# Patient Record
Sex: Male | Born: 1995
Health system: Southern US, Community
[De-identification: ages and names within clinical notes are randomized; demographics above are authoritative.]

## PROBLEM LIST (undated history)

## (undated) DIAGNOSIS — L299 Pruritus, unspecified: Secondary | ICD-10-CM

## (undated) DIAGNOSIS — M79673 Pain in unspecified foot: Secondary | ICD-10-CM

## (undated) DIAGNOSIS — R059 Cough, unspecified: Secondary | ICD-10-CM

## (undated) DIAGNOSIS — J039 Acute tonsillitis, unspecified: Secondary | ICD-10-CM

## (undated) DIAGNOSIS — R9431 Abnormal electrocardiogram [ECG] [EKG]: Secondary | ICD-10-CM

## (undated) DIAGNOSIS — R300 Dysuria: Secondary | ICD-10-CM

## (undated) DIAGNOSIS — R0602 Shortness of breath: Secondary | ICD-10-CM

## (undated) DIAGNOSIS — R5383 Other fatigue: Secondary | ICD-10-CM

## (undated) DIAGNOSIS — R42 Dizziness and giddiness: Secondary | ICD-10-CM

## (undated) DIAGNOSIS — S39012A Strain of muscle, fascia and tendon of lower back, initial encounter: Secondary | ICD-10-CM

## (undated) DIAGNOSIS — J069 Acute upper respiratory infection, unspecified: Secondary | ICD-10-CM

## (undated) DIAGNOSIS — B86 Scabies: Secondary | ICD-10-CM

## (undated) DIAGNOSIS — M545 Low back pain, unspecified: Secondary | ICD-10-CM

## (undated) DIAGNOSIS — J029 Acute pharyngitis, unspecified: Secondary | ICD-10-CM

## (undated) DIAGNOSIS — R079 Chest pain, unspecified: Secondary | ICD-10-CM

## (undated) DIAGNOSIS — M25552 Pain in left hip: Secondary | ICD-10-CM

## (undated) DIAGNOSIS — R030 Elevated blood-pressure reading, without diagnosis of hypertension: Secondary | ICD-10-CM

## (undated) HISTORY — DX: Cough, unspecified: R05.9

## (undated) HISTORY — DX: Low back pain, unspecified: M54.50

## (undated) HISTORY — DX: Acute upper respiratory infection, unspecified: J06.9

## (undated) HISTORY — DX: Other fatigue: R53.83

## (undated) HISTORY — DX: Pain in left hip: M25.552

## (undated) HISTORY — DX: Dizziness and giddiness: R42

## (undated) HISTORY — DX: Scabies: B86

## (undated) HISTORY — DX: Abnormal electrocardiogram (ECG) (EKG): R94.31

## (undated) HISTORY — DX: Pain in unspecified foot: M79.673

## (undated) HISTORY — DX: Dysuria: R30.0

## (undated) HISTORY — DX: Acute pharyngitis, unspecified: J02.9

## (undated) HISTORY — DX: Elevated blood-pressure reading, without diagnosis of hypertension: R03.0

## (undated) HISTORY — DX: Acute tonsillitis, unspecified: J03.90

## (undated) HISTORY — DX: Shortness of breath: R06.02

## (undated) HISTORY — DX: Strain of muscle, fascia and tendon of lower back, initial encounter: S39.012A

## (undated) HISTORY — DX: Chest pain, unspecified: R07.9

## (undated) HISTORY — DX: Pruritus, unspecified: L29.9

---
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
## 2013-03-12 ENCOUNTER — Ambulatory Visit: Payer: Self-pay | Admitting: Physician Assistant

## 2013-03-12 VITALS — BP 116/64 | HR 106 | Temp 101.4°F | Resp 22 | Ht 73.0 in | Wt 205.0 lb

## 2013-03-12 DIAGNOSIS — J039 Acute tonsillitis, unspecified: Secondary | ICD-10-CM

## 2013-03-12 DIAGNOSIS — J029 Acute pharyngitis, unspecified: Secondary | ICD-10-CM

## 2013-03-12 LAB — POCT CBC
Granulocyte percent: 81.9 %G — AB (ref 37–80)
HCT, POC: 47.3 % (ref 43.5–53.7)
Hemoglobin: 15 g/dL (ref 14.1–18.1)
Lymph, poc: 1.4 (ref 0.6–3.4)
MCH, POC: 29.5 pg (ref 27–31.2)
MCHC: 31.7 g/dL — AB (ref 31.8–35.4)
MCV: 93.2 fL (ref 80–97)
MID (cbc): 0.8 (ref 0–0.9)
MPV: 7.9 fL (ref 0–99.8)
POC Granulocyte: 10.2 — AB (ref 2–6.9)
POC LYMPH PERCENT: 11.3 %L (ref 10–50)
POC MID %: 6.8 %M (ref 0–12)
Platelet Count, POC: 155 10*3/uL (ref 142–424)
RBC: 5.08 M/uL (ref 4.69–6.13)
RDW, POC: 13.4 %
WBC: 12.4 10*3/uL — AB (ref 4.6–10.2)

## 2013-03-12 MED ORDER — AMOXICILLIN 500 MG PO TABS: 500.0000 mg | ORAL_TABLET | Freq: Two times a day (BID) | ORAL | Status: DC

## 2013-03-12 NOTE — Patient Instructions (Addendum)
Take ibuprofen 800 mg every 8 hours as needed for fever  Increase fluids and rest Start amoxicillin tonight.  If symptoms seem to be worsening or fail to improve over the next 3-5 days, recommend recheck.

## 2013-03-12 NOTE — Progress Notes (Signed)
Subjective:    Patient ID: Randy Barnett, male    DOB: 06-Aug-1995, 17 y.o.   MRN: [id number]  Fever  Associated symptoms include diarrhea, headaches and a sore throat. Pertinent negatives include no abdominal pain, congestion, coughing or ear pain.  Headache  Associated symptoms include a fever and a sore throat. Pertinent negatives include no abdominal pain, coughing, dizziness or ear pain.  Sore Throat  Associated symptoms include diarrhea and headaches. Pertinent negatives include no abdominal pain, congestion, coughing, ear pain, shortness of breath or trouble swallowing.  Diarrhea  Associated symptoms include chills, a fever and headaches. Pertinent negatives include no abdominal pain or coughing.   17 year old male presents for evaluation of sore throat, fever, chills, slight nausea, and fatigue.  States symptoms started 3 days ago and have persisted.  Denies nasal congestion, cough, vomiting, headache, otalgia, dizziness, or abdominal pain.  Has taken ibuprofen and ASA which has helped with the fever and body aches.  No known strep or flu contacts. Does have hx of strep as a child, but has not had in "years."  Patient is otherwise healthy with no other concerns today.     Review of Systems  Constitutional: Positive for fever, chills and fatigue.  HENT: Positive for sore throat. Negative for congestion, ear pain and trouble swallowing.   Respiratory: Negative for cough and shortness of breath.   Gastrointestinal: Positive for diarrhea. Negative for abdominal pain.  Neurological: Positive for headaches. Negative for dizziness.       Objective:   Physical Exam  Constitutional: He is oriented to person, place, and time. He appears well-developed and well-nourished.  HENT:  Head: Normocephalic and atraumatic.  Right Ear: Hearing, tympanic membrane, external ear and ear canal normal.  Left Ear: Hearing, tympanic membrane, external ear and ear canal normal.  Mouth/Throat:  Uvula is midline and mucous membranes are normal. Oropharyngeal exudate and posterior oropharyngeal erythema (3+ tonsillar swelling) present.  Eyes: Conjunctivae are normal.  Neck: Normal range of motion. Neck supple.  Cardiovascular: Normal rate, regular rhythm and normal heart sounds.   Pulmonary/Chest: Effort normal and breath sounds normal.  Lymphadenopathy:    He has cervical adenopathy.  Neurological: He is alert and oriented to person, place, and time.  Psychiatric: He has a normal mood and affect. His behavior is normal. Judgment and thought content normal.      Results for orders placed in visit on 03/12/13  POCT CBC      Result Value Range   WBC 12.4 (*) 4.6 - 10.2 K/uL   Lymph, poc 1.4  0.6 - 3.4   POC LYMPH PERCENT 11.3  10 - 50 %L   MID (cbc) 0.8  0 - 0.9   POC MID % 6.8  0 - 12 %M   POC Granulocyte 10.2 (*) 2 - 6.9   Granulocyte percent 81.9 (*) 37 - 80 %G   RBC 5.08  4.69 - 6.13 M/uL   Hemoglobin 15.0  14.1 - 18.1 g/dL   HCT, POC 40.9  81.1 - 53.7 %   MCV 93.2  80 - 97 fL   MCH, POC 29.5  27 - 31.2 pg   MCHC 31.7 (*) 31.8 - 35.4 g/dL   RDW, POC 91.4     Platelet Count, POC 155  142 - 424 K/uL   MPV 7.9  0 - 99.8 fL        Assessment & Plan:  Acute pharyngitis - Plan: POCT CBC  Acute  tonsillitis - Plan: amoxicillin (AMOXIL) 500 MG tablet  Will treat with amoxicillin 500 mg bid x 10 days Increase fluids and rest Continue ibuprofen or tylenol as needed for fever and aches Follow up if symptoms worsen or fail to improve.

## 2013-04-16 ENCOUNTER — Ambulatory Visit (INDEPENDENT_AMBULATORY_CARE_PROVIDER_SITE_OTHER): Payer: BC Managed Care – PPO | Admitting: Family Medicine

## 2013-04-16 VITALS — BP 146/82 | HR 89 | Temp 98.9°F | Resp 16 | Ht 74.5 in | Wt 200.0 lb

## 2013-04-16 DIAGNOSIS — B86 Scabies: Secondary | ICD-10-CM

## 2013-04-16 MED ORDER — PERMETHRIN 5 % EX CREA: 1.0000 "application " | TOPICAL_CREAM | Freq: Once | CUTANEOUS | Status: DC

## 2013-04-16 NOTE — Progress Notes (Signed)
Subjective:    Patient ID: Randy Barnett, male    DOB: 1995/04/15, 18 y.o.   MRN: [id number]  HPI Randy Barnett is a 18 y.o. male  Rash - reddened bumps past few months. Started with abdominal itching, wrists, ankles, groin, scrotum, then onto legs  - past month or two. Called by ex-girlfriend who was diagnosed scabies last week.  She had itching rash first. No recent fever.  No unexplained wt loss.  Prior [employer]. Plans on reenlisting. Some blisters on feet with running. Few bug bite appearing areas on penis.   There are no active problems to display for this patient.  History reviewed. No pertinent past medical history. History reviewed. No pertinent past surgical history. No Known Allergies Prior to Admission medications   Medication Sig Start Date End Date Taking? Authorizing Provider  amoxicillin (AMOXIL) 500 MG tablet Take 1 tablet (500 mg total) by mouth 2 (two) times daily. 03/12/13   Nelva NayHeather M Marte, PA-C   History   Social History  . Marital Status: Single    Spouse Name: N/A    Number of Children: N/A  . Years of Education: N/A   Occupational History  . Not on file.   Social History Main Topics  . Smoking status: Light Tobacco Smoker  . Smokeless tobacco: Not on file  . Alcohol Use: Not on file  . Drug Use: Not on file  . Sexual Activity: Not on file   Other Topics Concern  . Not on file   Social History Narrative  . No narrative on file    Review of Systems  Constitutional: Negative for fever and chills.  Genitourinary: Negative for dysuria and discharge.  Skin: Positive for rash.       Objective:   Physical Exam  Vitals reviewed. Constitutional: He is oriented to person, place, and time. He appears well-developed and well-nourished. No distress.  HENT:  Head: Normocephalic and atraumatic.  Pulmonary/Chest: Effort normal.  Genitourinary:    Right testis shows no swelling and no tenderness.  Left testis shows no swelling and no tenderness. No discharge found.  Neurological: He is alert and oriented to person, place, and time.  Skin: Skin is warm and dry. Rash noted. No petechiae noted.         Assessment & Plan:   Randy Barnett is a 18 y.o. male Scabies - Plan: permethrin (ACTICIN) 5 % cream Suspected scabies with sick contact. Will try permethrin x 2 - 1 week apart and discussed washing bedclothes in am. Discussed other testing tonight, including STI testing with 2 new partners, but declined at present. Suspect blisters on toes are friction blisters from boots, but recommended RPR if current rash does not improve with above and safer sex practices discussed in interim.   Meds ordered this encounter  Medications  . permethrin (ACTICIN) 5 % cream    Sig: Apply 1 application topically once. Apply from neck down, wash in 8-10 hours. Repeat dose in 1 week.    Dispense:  60 g    Refill:  1   Patient Instructions  Treat with cream tonight and repeat in 1 week. If rash not improved in next 2-3 weeks, recommend other testing or evaluation. Return to the clinic or go to the nearest emergency room if any of your symptoms worsen or new symptoms occur. Scabies Scabies are small bugs (mites) that burrow under the skin and cause red bumps and severe itching.  These bugs can only be seen with a microscope. Scabies are highly contagious. They can spread easily from person to person by direct contact. They are also spread through sharing clothing or linens that have the scabies mites living in them. It is not unusual for an entire family to become infected through shared towels, clothing, or bedding.  HOME CARE INSTRUCTIONS   Your caregiver may prescribe a cream or lotion to kill the mites. If cream is prescribed, massage the cream into the entire body from the neck to the bottom of both feet. Also massage the cream into the scalp and face if your child is less than 63 year old. Avoid the  eyes and mouth. Do not wash your hands after application.  Leave the cream on for 8 to 12 hours. Your child should bathe or shower after the 8 to 12 hour application period. Sometimes it is helpful to apply the cream to your child right before bedtime.  One treatment is usually effective and will eliminate approximately 95% of infestations. For severe cases, your caregiver may decide to repeat the treatment in 1 week. Everyone in your household should be treated with one application of the cream.  New rashes or burrows should not appear within 24 to 48 hours after successful treatment. However, the itching and rash may last for 2 to 4 weeks after successful treatment. Your caregiver may prescribe a medicine to help with the itching or to help the rash go away more quickly.  Scabies can live on clothing or linens for up to 3 days. All of your child's recently used clothing, towels, stuffed toys, and bed linens should be washed in hot water and then dried in a dryer for at least 20 minutes on high heat. Items that cannot be washed should be enclosed in a plastic bag for at least 3 days.  To help relieve itching, bathe your child in a cool bath or apply cool washcloths to the affected areas.  Your child may return to school after treatment with the prescribed cream. SEEK MEDICAL CARE IF:   The itching persists longer than 4 weeks after treatment.  The rash spreads or becomes infected. Signs of infection include red blisters or yellow-tan crust. Document Released: 03/16/2005 Document Revised: 06/08/2011 Document Reviewed: 07/25/2008 [facility] Patient Information 2014 [place], [place].     I personally performed the services described in this documentation, which was scribed in my presence. The recorded information has been reviewed and considered, and addended by me as needed.

## 2013-04-16 NOTE — Patient Instructions (Signed)
Treat with cream tonight and repeat in 1 week. If rash not improved in next 2-3 weeks, recommend other testing or evaluation. Return to the clinic or go to the nearest emergency room if any of your symptoms worsen or new symptoms occur. Scabies Scabies are small bugs (mites) that burrow under the skin and cause red bumps and severe itching. These bugs can only be seen with a microscope. Scabies are highly contagious. They can spread easily from person to person by direct contact. They are also spread through sharing clothing or linens that have the scabies mites living in them. It is not unusual for an entire family to become infected through shared towels, clothing, or bedding.  HOME CARE INSTRUCTIONS   Your caregiver may prescribe a cream or lotion to kill the mites. If cream is prescribed, massage the cream into the entire body from the neck to the bottom of both feet. Also massage the cream into the scalp and face if your child is less than 20 year old. Avoid the eyes and mouth. Do not wash your hands after application.  Leave the cream on for 8 to 12 hours. Your child should bathe or shower after the 8 to 12 hour application period. Sometimes it is helpful to apply the cream to your child right before bedtime.  One treatment is usually effective and will eliminate approximately 95% of infestations. For severe cases, your caregiver may decide to repeat the treatment in 1 week. Everyone in your household should be treated with one application of the cream.  New rashes or burrows should not appear within 24 to 48 hours after successful treatment. However, the itching and rash may last for 2 to 4 weeks after successful treatment. Your caregiver may prescribe a medicine to help with the itching or to help the rash go away more quickly.  Scabies can live on clothing or linens for up to 3 days. All of your child's recently used clothing, towels, stuffed toys, and bed linens should be washed in hot water  and then dried in a dryer for at least 20 minutes on high heat. Items that cannot be washed should be enclosed in a plastic bag for at least 3 days.  To help relieve itching, bathe your child in a cool bath or apply cool washcloths to the affected areas.  Your child may return to school after treatment with the prescribed cream. SEEK MEDICAL CARE IF:   The itching persists longer than 4 weeks after treatment.  The rash spreads or becomes infected. Signs of infection include red blisters or yellow-tan crust. Document Released: 03/16/2005 Document Revised: 06/08/2011 Document Reviewed: 07/25/2008 [facility] Patient Information 2014 [place].

## 2013-05-07 ENCOUNTER — Ambulatory Visit (INDEPENDENT_AMBULATORY_CARE_PROVIDER_SITE_OTHER): Payer: BC Managed Care – PPO | Admitting: Family Medicine

## 2013-05-07 VITALS — BP 128/70 | HR 86 | Temp 98.4°F | Resp 16 | Ht 73.0 in | Wt 200.6 lb

## 2013-05-07 DIAGNOSIS — Z7251 High risk heterosexual behavior: Secondary | ICD-10-CM

## 2013-05-07 DIAGNOSIS — B86 Scabies: Secondary | ICD-10-CM

## 2013-05-07 DIAGNOSIS — B370 Candidal stomatitis: Secondary | ICD-10-CM

## 2013-05-07 DIAGNOSIS — R21 Rash and other nonspecific skin eruption: Secondary | ICD-10-CM

## 2013-05-07 DIAGNOSIS — N489 Disorder of penis, unspecified: Secondary | ICD-10-CM

## 2013-05-07 LAB — RPR

## 2013-05-07 LAB — HIV ANTIBODY (ROUTINE TESTING W REFLEX): HIV: NONREACTIVE

## 2013-05-07 LAB — POCT SKIN KOH: Skin KOH, POC: POSITIVE

## 2013-05-07 MED ORDER — CLOTRIMAZOLE 10 MG MT TROC: 10.0000 mg | Freq: Every day | OROMUCOSAL | Status: DC

## 2013-05-07 NOTE — Progress Notes (Signed)
Subjective:    Patient ID: Randy Barnett, male    DOB: 28-May-1995, 18 y.o.   MRN: [id number]  HPI Randy Barnett is a 18 y.o. male Randy Barnett is a 18 y.o. male who presents to the [facility] complaining of few concerns today.   Seen 04/16/13 with suspected scabies as had sick contact. Prescribed permethrin with one refill. Discussed RPR testing at that time with foot rash, but declined. Applied both treatments - 9 days apart. Not itching anymore - rarely, some bumps resolved, but some still remain. Thinks partner was treated but not sure, has slept in same bed after treatments. No new rash. Foot areas are healing with 2 pairs of socks - less raw.   STI testing - not sure if ever had done. 5 lifetime partners. Has had unprotected oral sex, but condoms every time with vaginal intercourse. No penile discharge, rash on end of penis cleared up with permethrin.  Declined HSV, or hepatitis testing. Thinks had Hep B vaccination in past.   Thrush? - noticed after boot camp back about 5 months ago, treated with troches - only took 10 tablets, but improved in 3 days. Resolved until about a week and a half ago. Numb/raw feeling in top of mouth, and white patches on tongue, but no tongue soreness. Took amoxicillin in December for throat infection.   There are no active problems to display for this patient.  No past medical history on file. No past surgical history on file. No Known Allergies Prior to Admission medications   Medication Sig Start Date End Date Taking? Authorizing Provider  amoxicillin (AMOXIL) 500 MG tablet Take 1 tablet (500 mg total) by mouth 2 (two) times daily. 03/12/13   Nelva Nay, PA-C  permethrin (ACTICIN) 5 % cream Apply 1 application topically once. Apply from neck down, wash in 8-10 hours. Repeat dose in 1 week. 04/16/13   Shade Flood, MD   History   Social History  . Marital Status: Single    Spouse Name: N/A   Number of Children: N/A  . Years of Education: N/A   Occupational History  . Not on file.   Social History Main Topics  . Smoking status: Light Tobacco Smoker  . Smokeless tobacco: Not on file  . Alcohol Use: Not on file  . Drug Use: Not on file  . Sexual Activity: Not on file   Other Topics Concern  . Not on file   Social History Narrative  . No narrative on file    Review of Systems  Constitutional: Negative for fever and chills.  HENT: Positive for sore throat (top of mouth. ). Negative for trouble swallowing.   Gastrointestinal: Positive for vomiting (yesterday after eating at restaurant - now resolved. ).  Skin: Positive for rash.      Objective:   Physical Exam  Vitals reviewed. Constitutional: He is oriented to person, place, and time. He appears well-developed and well-nourished.  HENT:  Head: Normocephalic and atraumatic.  Right Ear: Tympanic membrane, external ear and ear canal normal.  Left Ear: Tympanic membrane, external ear and ear canal normal.  Nose: No rhinorrhea.  Mouth/Throat: Mucous membranes are normal. No oropharyngeal exudate or posterior oropharyngeal erythema.    Eyes: Conjunctivae are normal. Pupils are equal, round, and reactive to light.  Neck: Neck supple.  Cardiovascular: Normal rate, regular rhythm, normal heart sounds and intact distal pulses.   No murmur heard. Pulmonary/Chest: Effort normal  and breath sounds normal. He has no wheezes. He has no rhonchi. He has no rales.  Abdominal: Soft. There is no tenderness.  Genitourinary: Testes normal and penis normal. No penile erythema. No discharge found.  Healing/dry area on undersurface of penile shaft   Lymphadenopathy:    He has no cervical adenopathy.  Neurological: He is alert and oriented to person, place, and time.  Skin: Skin is warm and dry. No rash noted.     Psychiatric: He has a normal mood and affect. His behavior is normal.    Results for orders placed in visit on  05/07/13  POCT SKIN KOH      Result Value Range   Skin KOH, POC Positive         Assessment & Plan:   Randy Barnett is a 18 y.o. male Scabies -  Appears resolved/improving. Dry skin likely cause of thigh sx's - aveeno, and moisturizing soap. rtc if new rash, or pruritus persists.   Penile rash - now resolving, so suspect scabies as initial cause. No d/c noted, but with multiple partners and no prior STI testing - will check GC/Chlamydia Probe Amp urine,  HIV antibody, RPR  Thrush - Plan: clotrimazole (MYCELEX) 10 MG troche 5x/day - #35.  HIV testing pending, but recent antibiotics, and smokeless tobacco use may be contributory. Continued efforts at quitting "dip" discussed.   rtc precautions.    Meds ordered this encounter  Medications  . clotrimazole (MYCELEX) 10 MG troche    Sig: Take 1 tablet (10 mg total) by mouth 5 (five) times daily.    Dispense:  35 tablet    Refill:  0   Patient Instructions  Start mycelex for thrush.  You should receive a call or letter about your lab results within the next week to 10 days.  If any new rash on body, consider retreatment for scabies, but your symptoms may be due to dry skin at this point.  Start Dove for Men soap, Aveeno lotion to dry areas, and if itching not improved - return for recheck.

## 2013-05-07 NOTE — Patient Instructions (Signed)
Start mycelex for thrush.  You should receive a call or letter about your lab results within the next week to 10 days.  If any new rash on body, consider retreatment for scabies, but your symptoms may be due to dry skin at this point.  Start Dove for Men soap, Aveeno lotion to dry areas, and if itching not improved - return for recheck.

## 2013-05-08 LAB — GC/CHLAMYDIA PROBE AMP
CT Probe RNA: NEGATIVE
GC Probe RNA: NEGATIVE

## 2013-06-12 ENCOUNTER — Ambulatory Visit (INDEPENDENT_AMBULATORY_CARE_PROVIDER_SITE_OTHER): Payer: BC Managed Care – PPO | Admitting: Physician Assistant

## 2013-06-12 VITALS — BP 118/82 | HR 81 | Temp 98.6°F | Resp 16 | Ht 74.0 in | Wt 210.8 lb

## 2013-06-12 DIAGNOSIS — J039 Acute tonsillitis, unspecified: Secondary | ICD-10-CM

## 2013-06-12 DIAGNOSIS — J020 Streptococcal pharyngitis: Secondary | ICD-10-CM

## 2013-06-12 DIAGNOSIS — J029 Acute pharyngitis, unspecified: Secondary | ICD-10-CM

## 2013-06-12 LAB — POCT RAPID STREP A (OFFICE): Rapid Strep A Screen: POSITIVE — AB

## 2013-06-12 MED ORDER — AMOXICILLIN 500 MG PO TABS: 500.0000 mg | ORAL_TABLET | Freq: Two times a day (BID) | ORAL | Status: DC

## 2013-06-12 MED ORDER — FIRST-DUKES MOUTHWASH MT SUSP: 10.0000 mL | OROMUCOSAL | Status: DC | PRN

## 2013-06-12 NOTE — Progress Notes (Signed)
   Subjective:    Patient ID: Randy Barnett, male    DOB: 10-May-1995, 18 y.o.   MRN: [id number]  HPI   Mr. Randy Barnett is a pleasant 18 yr old male here with concern for illness.  Reports his tonsils are swollen - "almost touching."  States he's been having throat problems for a couple weeks.  He does have throat pain, but not as bad as he did.  He denies fever, chills, cough, congestion.  Does feel very fatigued.  No known sick contacts.  No trouble swallowing.  Has been using Tylenol, Advil for pain relief.  Also doing salt water gargles.  Thinks his symptoms may be attributable to his smoking and dipping - has been doing this "all my life."   Review of Systems  Constitutional: Negative for fever and chills.  HENT: Positive for sore throat. Negative for congestion, ear pain, rhinorrhea, trouble swallowing and voice change.   Respiratory: Negative for cough, shortness of breath and wheezing.   Cardiovascular: Negative.   Gastrointestinal: Negative.   Musculoskeletal: Negative.        Objective:   Physical Exam  Vitals reviewed. Constitutional: He is oriented to person, place, and time. He appears well-developed and well-nourished. No distress.  HENT:  Head: Normocephalic and atraumatic.  Right Ear: Tympanic membrane and ear canal normal.  Left Ear: Tympanic membrane and ear canal normal.  Mouth/Throat: Uvula is midline and mucous membranes are normal. Posterior oropharyngeal edema and posterior oropharyngeal erythema present. No oropharyngeal exudate or tonsillar abscesses.  Tonsils swollen bilaterally but airway is widely patent  Eyes: Conjunctivae are normal. No scleral icterus.  Neck: Neck supple.  Cardiovascular: Normal rate, regular rhythm and normal heart sounds.   Pulmonary/Chest: Effort normal and breath sounds normal. He has no wheezes. He has no rales.  Abdominal: Soft. There is no tenderness.  Lymphadenopathy:    He has cervical adenopathy (tender, anterior).    Neurological: He is alert and oriented to person, place, and time.  Skin: Skin is warm and dry.  Psychiatric: He has a normal mood and affect. His behavior is normal.    Results for orders placed in visit on 06/12/13  POCT RAPID STREP A (OFFICE)      Result Value Ref Range   Rapid Strep A Screen Positive (*) Negative         Assessment & Plan:  Strep pharyngitis - Plan: amoxicillin (AMOXIL) 500 MG tablet  Acute pharyngitis - Plan: POCT rapid strep A, Diphenhyd-Hydrocort-Nystatin (FIRST-DUKES MOUTHWASH) SUSP, CANCELED: POCT CBC, CANCELED: Culture, Group A Strep  Acute tonsillitis - Plan: POCT rapid strep A, CANCELED: POCT CBC, CANCELED: Culture, Group A Strep   Mr. Randy Barnett is a pleasant 18 yr old male with strep pharyngitis.  Will treat with amox BID x 10 days.  Magic Mouthwash if needed for pain relief.  May also use Tylenol, Advil.  Push fluids, rest.  Avoid sharing food, drinks; wash hands; cover mouth; new toothbrush.  Encouraged pt to stop both dipping and smoking due to adverse health effects.    Pt to call or RTC if worsening or not improving  E. Frances FurbishElizabeth Feleshia Zundel MHS, PA-C [facility] 3/16/20158:22 PM

## 2013-06-12 NOTE — Patient Instructions (Signed)
The antibiotic prescribed today is for your present infection only. It is very important to follow the directions for the medication prescribed. Antibiotics are generally given for a specified period of time (7-10 days, for example) to be taken at specific intervals (every 4, 6, 8 or 12 hours). This is necessary to keep the right amount of the medication in the bloodstream. Too much of the medication may cause an adverse reaction, too little may not be completely effective.  To clear your infection completely, continue taking the antibiotic for the full time of treatment, even if you begin to feel better after a few days.  If you miss a dose of the antibiotic, take it as soon as possible. Then go back to your regular dosing schedule. However, don't double up doses.    Begin taking the amoxicillin as directed.  Be sure to finish the full course  Continue using Tylenol and/or Advil for pain relief  Use the Magic Mouthwash as frequently as every 2 hours if needed for throat pain  Plenty of fluids (water is best!) and rest  Don't share food or drinks with others.  Cover your mouth if coughing or sneezing.  Get a new toothbrush.  STOP dipping and smoking!  Please let us know if any symptoms are worsening or not improving   Strep Throat Strep throat is an infection of the throat caused by a bacteria named Streptococcus pyogenes. Your caregiver may call the infection streptococcal "tonsillitis" or "pharyngitis" depending on whether there are signs of inflammation in the tonsils or back of the throat. Strep throat is most common in children aged 5 15 years during the cold months of the year, but it can occur in people of any age during any season. This infection is spread from person to person (contagious) through coughing, sneezing, or other close contact. SYMPTOMS   Fever or chills.  Painful, swollen, red tonsils or throat.  Pain or difficulty when swallowing.  White or yellow spots on the  tonsils or throat.  Swollen, tender lymph nodes or "glands" of the neck or under the jaw.  Red rash all over the body (rare). DIAGNOSIS  Many different infections can cause the same symptoms. A test must be done to confirm the diagnosis so the right treatment can be given. A "rapid strep test" can help your caregiver make the diagnosis in a few minutes. If this test is not available, a light swab of the infected area can be used for a throat culture test. If a throat culture test is done, results are usually available in a day or two. TREATMENT  Strep throat is treated with antibiotic medicine. HOME CARE INSTRUCTIONS   Gargle with 1 tsp of salt in 1 cup of warm water, 3 4 times per day or as needed for comfort.  Family members who also have a sore throat or fever should be tested for strep throat and treated with antibiotics if they have the strep infection.  Make sure everyone in your household washes their hands well.  Do not share food, drinking cups, or personal items that could cause the infection to spread to others.  You may need to eat a soft food diet until your sore throat gets better.  Drink enough water and fluids to keep your urine clear or pale yellow. This will help prevent dehydration.  Get plenty of rest.  Stay home from school, daycare, or work until you have been on antibiotics for 24 hours.  Only take over-the-counter  or prescription medicines for pain, discomfort, or fever as directed by your caregiver.  If antibiotics are prescribed, take them as directed. Finish them even if you start to feel better. SEEK MEDICAL CARE IF:   The glands in your neck continue to enlarge.  You develop a rash, cough, or earache.  You cough up green, yellow-brown, or bloody sputum.  You have pain or discomfort not controlled by medicines.  Your problems seem to be getting worse rather than better. SEEK IMMEDIATE MEDICAL CARE IF:   You develop any new symptoms such as  vomiting, severe headache, stiff or painful neck, chest pain, shortness of breath, or trouble swallowing.  You develop severe throat pain, drooling, or changes in your voice.  You develop swelling of the neck, or the skin on the neck becomes red and tender.  You have a fever.  You develop signs of dehydration, such as fatigue, dry mouth, and decreased urination.  You become increasingly sleepy, or you cannot wake up completely. Document Released: 03/13/2000 Document Revised: 03/02/2012 Document Reviewed: 05/15/2010 [facility] Patient Information 2014 [place], [place].

## 2013-07-17 ENCOUNTER — Ambulatory Visit (INDEPENDENT_AMBULATORY_CARE_PROVIDER_SITE_OTHER): Payer: BC Managed Care – PPO | Admitting: Physician Assistant

## 2013-07-17 VITALS — BP 130/70 | HR 73 | Temp 98.2°F | Ht 72.0 in | Wt 213.2 lb

## 2013-07-17 DIAGNOSIS — R300 Dysuria: Secondary | ICD-10-CM

## 2013-07-17 DIAGNOSIS — R369 Urethral discharge, unspecified: Secondary | ICD-10-CM

## 2013-07-17 DIAGNOSIS — Z113 Encounter for screening for infections with a predominantly sexual mode of transmission: Secondary | ICD-10-CM

## 2013-07-17 LAB — POCT URINALYSIS DIPSTICK
Bilirubin, UA: NEGATIVE
Blood, UA: NEGATIVE
Glucose, UA: NEGATIVE
Ketones, UA: NEGATIVE
Leukocytes, UA: NEGATIVE
Nitrite, UA: NEGATIVE
Protein, UA: NEGATIVE
Spec Grav, UA: >=1.03
Urobilinogen, UA: 0.2
pH, UA: 6

## 2013-07-17 LAB — POCT UA - MICROSCOPIC ONLY
Casts, Ur, LPF, POC: NEGATIVE
Crystals, Ur, HPF, POC: NEGATIVE
Mucus, UA: NEGATIVE
Yeast, UA: NEGATIVE

## 2013-07-17 MED ORDER — CEFTRIAXONE SODIUM 1 G IJ SOLR
250.0000 mg | Freq: Once | INTRAMUSCULAR | Status: AC
  Administered 2013-07-17: 250 mg via INTRAMUSCULAR

## 2013-07-17 MED ORDER — AZITHROMYCIN 500 MG PO TABS: 1000.0000 mg | ORAL_TABLET | Freq: Every day | ORAL | Status: DC

## 2013-07-17 NOTE — Progress Notes (Signed)
Subjective:    Patient ID: Randy Barnett, male    DOB: 09-09-1995, 18 y.o.   MRN: [id number]  Urinary Frequency  Associated symptoms include frequency.    Otherwise healthy 17y.o male patient presents with burning and frequency with urination for past week.  Also with greenish yellow discharge for past 3 days.  Pt had negative STI screening on 2/8.  One week later, male partner called and told him she had multiple partners and was diagnosed with HSV-1 and chlamydia.  Has had another partner since.  States he uses a condom every time.  No hx of STI.  Denies N/V/D, fever, flank pain, bloody urine.    Review of Systems  Constitutional: Negative.   HENT: Negative.   Respiratory: Negative.   Cardiovascular: Negative.   Gastrointestinal: Negative.   Genitourinary: Positive for frequency and discharge.  Musculoskeletal: Negative.   Skin: Negative.   Allergic/Immunologic: Negative.   Neurological: Negative.        Objective:   Physical Exam  Constitutional: He is oriented to person, place, and time. He appears well-developed and well-nourished. No distress.  HENT:  Head: Normocephalic.  Eyes: Conjunctivae are normal. Pupils are equal, round, and reactive to light.  Cardiovascular: Normal rate, regular rhythm and normal heart sounds.   Pulmonary/Chest: Effort normal and breath sounds normal. No respiratory distress. He exhibits no tenderness.  Genitourinary: Testes normal. Right testis shows no mass and no tenderness. Left testis shows no mass and no tenderness. Circumcised. Discharge found.  Neurological: He is alert and oriented to person, place, and time.  Skin: Skin is warm and dry. No rash noted.  Psychiatric: He has a normal mood and affect. His behavior is normal.     Results for orders placed in visit on 07/17/13  POCT UA - MICROSCOPIC ONLY      Result Value Ref Range   WBC, Ur, HPF, POC 0-2     RBC, urine, microscopic 0-1     Bacteria, U Microscopic trace     Mucus, UA neg     Epithelial cells, urine per micros 0-1     Crystals, Ur, HPF, POC neg     Casts, Ur, LPF, POC neg     Yeast, UA neg    POCT URINALYSIS DIPSTICK      Result Value Ref Range   Color, UA yellow     Clarity, UA clear     Glucose, UA neg     Bilirubin, UA neg     Ketones, UA neg     Spec Grav, UA >=1.030     Blood, UA neg     pH, UA 6.0     Protein, UA neg     Urobilinogen, UA 0.2     Nitrite, UA neg     Leukocytes, UA Negative          Assessment & Plan:   1. Dysuria 2. Penile discharge 3. Routine screening for STI (sexually transmitted infection) Treated empirically for GC/Chlamydia.  Awaiting further results.  Will have pt follow up in 3 months to test for re-infection. - POCT UA - Microscopic Only - POCT urinalysis dipstick - Urine culture - GC/Chlamydia Probe Amp - HIV antibody - HSV(herpes simplex vrs) 1+2 ab-IgG - RPR - Hepatitis B surface antigen - Hepatitis B surface antibody - Hepatitis C antibody - cefTRIAXone (ROCEPHIN) injection 250 mg; Inject 0.25 g (250 mg total) into the muscle once. - azithromycin (ZITHROMAX) 500 MG tablet; Take 2 tablets (1,000 mg total)  by mouth daily.  Dispense: 2 tablet; Refill: 0

## 2013-07-17 NOTE — Patient Instructions (Addendum)
Take the azithromycin tablets together in 1 dose.  It can cause nausea and vomiting, so take it with food. If you throw up within 2 hours of taking the pills, please let us know, we will need to re-order it.  Please continue to good practice of using condoms every single time you have sex.  I will contact you with your lab results as soon as they are available.   If you have not heard from me in 2 weeks, please contact me.  The fastest way to get your results is to register for My Chart (see the instructions on the last page of this printout).

## 2013-07-18 LAB — RPR

## 2013-07-18 LAB — HIV ANTIBODY (ROUTINE TESTING W REFLEX): HIV: NONREACTIVE

## 2013-07-18 LAB — HEPATITIS C ANTIBODY: HCV Ab: NEGATIVE

## 2013-07-18 LAB — HEPATITIS B SURFACE ANTIBODY, QUANTITATIVE: HEPATITIS B-POST: 0.3 m[IU]/mL

## 2013-07-18 LAB — HEPATITIS B SURFACE ANTIGEN: HEP B S AG: NEGATIVE

## 2013-07-18 NOTE — Progress Notes (Signed)
I have examined this patient along with the student and agree.  

## 2013-07-19 LAB — URINE CULTURE
COLONY COUNT: NO GROWTH
Organism ID, Bacteria: NO GROWTH

## 2013-07-19 LAB — HSV(HERPES SIMPLEX VRS) I + II AB-IGG
HSV 1 GLYCOPROTEIN G AB, IGG: 0.26 IV
HSV 2 Glycoprotein G Ab, IgG: <0.1 IV

## 2013-07-19 LAB — GC/CHLAMYDIA PROBE AMP
CT Probe RNA: POSITIVE — AB
GC Probe RNA: NEGATIVE

## 2013-09-24 ENCOUNTER — Ambulatory Visit (INDEPENDENT_AMBULATORY_CARE_PROVIDER_SITE_OTHER): Payer: BC Managed Care – PPO | Admitting: Physician Assistant

## 2013-09-24 VITALS — BP 116/72 | HR 87 | Temp 98.5°F | Resp 18 | Ht 73.5 in | Wt 212.0 lb

## 2013-09-24 DIAGNOSIS — J029 Acute pharyngitis, unspecified: Secondary | ICD-10-CM

## 2013-09-24 LAB — POCT RAPID STREP A (OFFICE): Rapid Strep A Screen: NEGATIVE

## 2013-09-24 MED ORDER — PENICILLIN V POTASSIUM 500 MG PO TABS: 500.0000 mg | ORAL_TABLET | Freq: Two times a day (BID) | ORAL | Status: DC

## 2013-09-24 NOTE — Patient Instructions (Signed)
The antibiotic prescribed today is for your present infection only. It is very important to follow the directions for the medication prescribed. Antibiotics are generally given for a specified period of time (7-10 days, for example) to be taken at specific intervals (every 4, 6, 8 or 12 hours). This is necessary to keep the right amount of the medication in the bloodstream. Too much of the medication may cause an adverse reaction, too little may not be completely effective.  To clear your infection completely, continue taking the antibiotic for the full time of treatment, even if you begin to feel better after a few days.  If you miss a dose of the antibiotic, take it as soon as possible. Then go back to your regular dosing schedule. However, don't double up doses.    Take the penicillin as directed.  Finish the full course, even when you begin feeling better  Take ibuprofen 600-800mg  every 8 hours to help with pain and inflammation  Continue taking the tramadol as that will help with pain as well  Stay hydrated  If you are worsening or not improving, please let us know  If you develop strep again, I think it would be a good idea to see an ENT doctor   Strep Throat Strep throat is an infection of the throat caused by a bacteria named Streptococcus pyogenes. Your caregiver may call the infection streptococcal "tonsillitis" or "pharyngitis" depending on whether there are signs of inflammation in the tonsils or back of the throat. Strep throat is most common in children aged 5-15 years during the cold months of the year, but it can occur in people of any age during any season. This infection is spread from person to person (contagious) through coughing, sneezing, or other close contact. SYMPTOMS   Fever or chills.  Painful, swollen, red tonsils or throat.  Pain or difficulty when swallowing.  White or yellow spots on the tonsils or throat.  Swollen, tender lymph nodes or "glands" of the neck  or under the jaw.  Red rash all over the body (rare). DIAGNOSIS  Many different infections can cause the same symptoms. A test must be done to confirm the diagnosis so the right treatment can be given. A "rapid strep test" can help your caregiver make the diagnosis in a few minutes. If this test is not available, a light swab of the infected area can be used for a throat culture test. If a throat culture test is done, results are usually available in a day or two. TREATMENT  Strep throat is treated with antibiotic medicine. HOME CARE INSTRUCTIONS   Gargle with 1 tsp of salt in 1 cup of warm water, 3-4 times per day or as needed for comfort.  Family members who also have a sore throat or fever should be tested for strep throat and treated with antibiotics if they have the strep infection.  Make sure everyone in your household washes their hands well.  Do not share food, drinking cups, or personal items that could cause the infection to spread to others.  You may need to eat a soft food diet until your sore throat gets better.  Drink enough water and fluids to keep your urine clear or pale yellow. This will help prevent dehydration.  Get plenty of rest.  Stay home from school, daycare, or work until you have been on antibiotics for 24 hours.  Only take over-the-counter or prescription medicines for pain, discomfort, or fever as directed by your caregiver.  If antibiotics are prescribed, take them as directed. Finish them even if you start to feel better. SEEK MEDICAL CARE IF:   The glands in your neck continue to enlarge.  You develop a rash, cough, or earache.  You cough up green, yellow-brown, or bloody sputum.  You have pain or discomfort not controlled by medicines.  Your problems seem to be getting worse rather than better. SEEK IMMEDIATE MEDICAL CARE IF:   You develop any new symptoms such as vomiting, severe headache, stiff or painful neck, chest pain, shortness of  breath, or trouble swallowing.  You develop severe throat pain, drooling, or changes in your voice.  You develop swelling of the neck, or the skin on the neck becomes red and tender.  You have a fever.  You develop signs of dehydration, such as fatigue, dry mouth, and decreased urination.  You become increasingly sleepy, or you cannot wake up completely. Document Released: 03/13/2000 Document Revised: 03/02/2012 Document Reviewed: 05/15/2010 [facility] Patient Information 2015 [place], [place]. This information is not intended to replace advice given to you by your health care provider. Make sure you discuss any questions you have with your health care provider.

## 2013-09-24 NOTE — Progress Notes (Signed)
   Subjective:    Patient ID: Randy Barnett, male    DOB: [birth year], 18 y.o.   MRN: [id number]  HPI   Randy Barnett is a pleasant 18 yr old male here because "I got strep again."  He reports 2 days of sore throat, painful swallowing, swollen lymph nodes.  Subjective fever but none measured.  Throat pain is bilateral.  He denies URI symptoms.  His mother did have strep this past week, but he does not live with her and only saw her once for about 10 minutes.  No drooling but +voice change.  Taking tramadol for a finger injury which is helping his throat pain as well.  Tried magic mouthwash with little relief.  He had strep in March 2015 and was also treated presumptively for strep in Dec 2014.  Pt does smoke and dip - concerned that recurrent strep infections may indicate oral cancer.    Review of Systems  Constitutional: Positive for fever (subjective) and chills.  HENT: Positive for sore throat, trouble swallowing (due to pain) and voice change. Negative for congestion, drooling, ear pain and rhinorrhea.   Respiratory: Negative for cough, shortness of breath and wheezing.   Cardiovascular: Negative.   Gastrointestinal: Negative for nausea, vomiting and abdominal pain.  Neurological: Positive for headaches.       Objective:   Physical Exam  Vitals reviewed. Constitutional: He is oriented to person, place, and time. He appears well-developed and well-nourished. No distress.  HENT:  Head: Normocephalic and atraumatic.  Right Ear: Tympanic membrane and ear canal normal.  Left Ear: Tympanic membrane and ear canal normal.  Mouth/Throat: Uvula is midline. Oropharyngeal exudate (RIGHT tonsil), posterior oropharyngeal edema (mild) and posterior oropharyngeal erythema present. No tonsillar abscesses.  Eyes: Conjunctivae are normal. No scleral icterus.  Neck: Neck supple.  Cardiovascular: Normal rate, regular rhythm and normal heart sounds.   Pulmonary/Chest: Effort normal and breath sounds  normal. He has no wheezes. He has no rales.  Lymphadenopathy:    He has cervical adenopathy.  Neurological: He is alert and oriented to person, place, and time.  Skin: Skin is warm and dry.  Psychiatric: He has a normal mood and affect. His behavior is normal.    Results for orders placed in visit on 09/24/13  POCT RAPID STREP A (OFFICE)      Result Value Ref Range   Rapid Strep A Screen Negative  Negative         Assessment & Plan:  Acute pharyngitis, unspecified pharyngitis type - Plan: POCT rapid strep A, Culture, Group A Strep, penicillin v potassium (VEETID) 500 MG tablet   Randy Barnett is a pleasant 18 yr old male here with acute pharyngitis.  No associated URI symptoms.  Throat is beefy red with exudate at the RIGHT tonsil.  There is no evidence of tonsillar abscess.  Rapid strep is negative.  Cx pending . Will go ahead and start pen vk empirically for strep coverage.  Continue Tramadol for finger pain/throat pain.  Add ibuprofen 600-800mg  q8h prn.  Maintain hydration.  If worsening or no improvement in 48 hours, needs to RTC.  Work note provided.  If he develops strep again, would recommend ENT eval  Pt to call or RTC if worsening or not improving  E. Frances FurbishElizabeth Egan MHS, PA-C [facility] 6/28/201511:30 AM

## 2013-09-25 LAB — CULTURE, GROUP A STREP

## 2013-11-24 ENCOUNTER — Telehealth: Payer: Self-pay | Admitting: Radiology

## 2013-11-24 ENCOUNTER — Telehealth: Payer: Self-pay | Admitting: Family Medicine

## 2013-11-24 NOTE — Telephone Encounter (Signed)
Pt called inquiring about his labs that were done in April. For some reason, these labs were never reviewed and the pt was never notified about his lab results--including positive chlamydia. Fortunately, the pt was treated in office with rocephin and zithromax. The pt was calm, but upset, understandably so, that he was never informed of his lab results at all, even worse that he was positive for chlamydia, Inocencio Homes talked to the pt first, I talked to the patient and answered some of his questions, while apologizing repeatedly about never being notified. The pt had a lot of questions so Benny Lennert ended up talking to him and answering all of his questions.

## 2013-11-24 NOTE — Telephone Encounter (Signed)
Patient called concerned that he had not gotten his results from his visit from April.  They had not been reviewed by Chelle so we let him speak with Huntley Dec.

## 2013-11-24 NOTE — Telephone Encounter (Signed)
Spoke with patient and d/w him his results and answered his questions.  I apologized for him not getting a phone call regarding his results but did let him know that he had been treated at his visit for the chlamydia.

## 2013-11-27 NOTE — Telephone Encounter (Signed)
I have reviewed his chart, each visit and labs beginning in February of this year.  It is highly unusual that there is no result note/letter or My Chart message to him regarding the results.    He was treated for presumptive chlamydia while at the visit. He was also seen here on 09/24/2013 for another reason, but did not ask about the results. In addition, the AVS he was given advises him to contact the office if he didn't hear regarding the results in 2 weeks.  Thank you to my colleagues for answering this patient's questions and apologizing that he was not notified of his results.

## 2013-12-08 ENCOUNTER — Ambulatory Visit (INDEPENDENT_AMBULATORY_CARE_PROVIDER_SITE_OTHER): Payer: BC Managed Care – PPO | Admitting: Family Medicine

## 2013-12-08 VITALS — BP 128/72 | HR 105 | Temp 102.7°F | Resp 18 | Ht 73.0 in | Wt 221.0 lb

## 2013-12-08 DIAGNOSIS — J029 Acute pharyngitis, unspecified: Secondary | ICD-10-CM

## 2013-12-08 DIAGNOSIS — IMO0001 Reserved for inherently not codable concepts without codable children: Secondary | ICD-10-CM

## 2013-12-08 DIAGNOSIS — R509 Fever, unspecified: Secondary | ICD-10-CM

## 2013-12-08 DIAGNOSIS — M791 Myalgia, unspecified site: Secondary | ICD-10-CM

## 2013-12-08 DIAGNOSIS — R110 Nausea: Secondary | ICD-10-CM

## 2013-12-08 LAB — POCT INFLUENZA A/B
INFLUENZA B, POC: NEGATIVE
Influenza A, POC: NEGATIVE

## 2013-12-08 LAB — POCT RAPID STREP A (OFFICE): Rapid Strep A Screen: NEGATIVE

## 2013-12-08 LAB — POCT CBC
GRANULOCYTE PERCENT: 79.9 % (ref 37–80)
HCT, POC: 43.4 % — AB (ref 43.5–53.7)
Hemoglobin: 14.6 g/dL (ref 14.1–18.1)
Lymph, poc: 0.9 (ref 0.6–3.4)
MCH, POC: 29.6 pg (ref 27–31.2)
MCHC: 33.7 g/dL (ref 31.8–35.4)
MCV: 87.9 fL (ref 80–97)
MID (CBC): 0.3 (ref 0–0.9)
MPV: 6.2 fL (ref 0–99.8)
PLATELET COUNT, POC: 104 10*3/uL — AB (ref 142–424)
POC GRANULOCYTE: 5 (ref 2–6.9)
POC LYMPH %: 14.7 % (ref 10–50)
POC MID %: 5.4 % (ref 0–12)
RBC: 4.94 M/uL (ref 4.69–6.13)
RDW, POC: 12.8 %
WBC: 6.2 10*3/uL (ref 4.6–10.2)

## 2013-12-08 MED ORDER — IBUPROFEN 200 MG PO TABS
600.0000 mg | ORAL_TABLET | Freq: Once | ORAL | Status: AC
  Administered 2013-12-08: 600 mg via ORAL

## 2013-12-08 MED ORDER — ONDANSETRON 8 MG PO TBDP: 8.0000 mg | ORAL_TABLET | Freq: Three times a day (TID) | ORAL | Status: DC | PRN

## 2013-12-08 NOTE — Progress Notes (Signed)
Subjective:    Patient ID: Randy Barnett, male    DOB: October 12, 1995, 18 y.o.   MRN: [id number]  HPI Patient presents this morning with 4 day history of nausea, headache, fever/chills. Pain in eyes and headache. Sore throat for 2 days, throat feels fuzzy. Has decreased appetite. No vomiting, some gagging. Felt worse last night. Has been taking 2-3 aspirin twice a day for a couple of days with little relief. No sick contacts prior to becoming ill.  Has been traveling [place] and [place] to various beaches for [place]. Very little alcohol intake in last month. Had two glasses of sangria last weekend.   Was diagnosed with chlamydia 4/15 and was treated. Has been abstinent since then.   Review of Systems +fever, no chest pain, +myalgias, chest feels hollow when he takes a big breath, no wheezing, little cough, no dysuria, no hematuria, no urinary frequency, no penile discharge.    Objective:   Physical Exam  Vitals reviewed. Constitutional: He is oriented to person, place, and time. He appears well-developed and well-nourished. He appears ill.  HENT:  Head: Normocephalic and atraumatic.  Right Ear: Tympanic membrane, external ear and ear canal normal.  Left Ear: Tympanic membrane, external ear and ear canal normal.  Nose: Rhinorrhea present. Right sinus exhibits no maxillary sinus tenderness and no frontal sinus tenderness. Left sinus exhibits no maxillary sinus tenderness and no frontal sinus tenderness.  Mouth/Throat: Uvula is midline and mucous membranes are normal. Posterior oropharyngeal erythema present. No oropharyngeal exudate, posterior oropharyngeal edema or tonsillar abscesses.  Eyes: Conjunctivae are normal. Pupils are equal, round, and reactive to light.  Neck: Normal range of motion. Neck supple.  Cardiovascular: Regular rhythm and normal heart sounds.  Tachycardia present.   Pulmonary/Chest: Effort normal and breath sounds normal.  Musculoskeletal: Normal range of motion.    Neurological: He is alert and oriented to person, place, and time.  Skin: Skin is warm and dry.  Psychiatric: He has a normal mood and affect. His behavior is normal. Judgment and thought content normal.   Results for orders placed in visit on 12/08/13  POCT CBC      Result Value Ref Range   WBC 6.2  4.6 - 10.2 K/uL   Lymph, poc 0.9  0.6 - 3.4   POC LYMPH PERCENT 14.7  10 - 50 %L   MID (cbc) 0.3  0 - 0.9   POC MID % 5.4  0 - 12 %M   POC Granulocyte 5.0  2 - 6.9   Granulocyte percent 79.9  37 - 80 %G   RBC 4.94  4.69 - 6.13 M/uL   Hemoglobin 14.6  14.1 - 18.1 g/dL   HCT, POC 56.3 (*) 87.5 - 53.7 %   MCV 87.9  80 - 97 fL   MCH, POC 29.6  27 - 31.2 pg   MCHC 33.7  31.8 - 35.4 g/dL   RDW, POC 64.3     Platelet Count, POC 104 (*) 142 - 424 K/uL   MPV 6.2  0 - 99.8 fL  POCT INFLUENZA A/B      Result Value Ref Range   Influenza A, POC Negative     Influenza B, POC Negative    POCT RAPID STREP A (OFFICE)      Result Value Ref Range   Rapid Strep A Screen Negative  Negative   Felt better 30 minutes after taking Motrin 600 mg in the office.      Assessment & Plan:  1. Fever, unspecified - POCT CBC - POCT Influenza A/B - ibuprofen (ADVIL,MOTRIN) tablet 600 mg; Take 3 tablets (600 mg total) by mouth once. -I suspect this is viral in nature given symptoms, nml WBC. - Provided written and verbal information regarding diagnosis and treatment. -Treat fever with ibuprofen 2-3 tablets every 8 hours  Drink 8-10 glasses fluid a day Come back in if you aren't feeling better in 2-3 days or sooner if you get worse.   2. Acute pharyngitis, unspecified pharyngitis type - POCT rapid strep A  3. Nausea alone - ondansetron (ZOFRAN-ODT) 8 MG disintegrating tablet; Take 1 tablet (8 mg total) by mouth every 8 (eight) hours as needed for nausea.  Dispense: 20 tablet; Refill: 0    Emi Belfast, FNP-BC  [facility]  12/08/2013 1:02 PM

## 2013-12-08 NOTE — Patient Instructions (Signed)
Treat fever with ibuprofen 2-3 tablets every 8 hours  Drink 8-10 glasses fluid a day Come back in if you aren't feeling better in 2-3 days or sooner if you get worse.

## 2013-12-18 ENCOUNTER — Ambulatory Visit (INDEPENDENT_AMBULATORY_CARE_PROVIDER_SITE_OTHER): Payer: BC Managed Care – PPO | Admitting: Physician Assistant

## 2013-12-18 VITALS — BP 120/70 | HR 80 | Temp 98.8°F | Resp 16 | Ht 73.0 in | Wt 220.0 lb

## 2013-12-18 DIAGNOSIS — Z113 Encounter for screening for infections with a predominantly sexual mode of transmission: Secondary | ICD-10-CM

## 2013-12-18 NOTE — Progress Notes (Signed)
   Subjective:    Patient ID: Randy Barnett, male    DOB: [birth year], 18 y.o.   MRN: [id number]   PCP: No PCP Per Patient  Chief Complaint  Patient presents with  . Exposure to STD  . Penis Pain    groin area- genital warts/Herpes concern    Medications, allergies, past medical history, surgical history, family history, social history and problem list reviewed and updated.  HPI  This 18 y.o. male presents for evaluation of a small pink bump on the RIGHT side of the penis. He also has noticed tiny small papules around the rim of the penis that he's worried about. No sex in the past 1 month.  Some intimacy, but no genital to genital contact in the past 3 months.  No dysuria unless he holds his urine for extended periods of time.  No urinary urgency or frequency. No penile discharge. No tender or enlarged lymph nodes.  He was tested in 06/2013, positive for chlamydia.  HSV, HIV, RPR and GC were negative at that time.   Review of Systems     Objective:   Physical Exam  Constitutional: He is oriented to person, place, and time. He appears well-developed and well-nourished. He is active and cooperative. No distress.  BP 120/70  Pulse 80  Temp(Src) 98.8 F (37.1 C) (Oral)  Resp 16  Ht  (1.854 m)  Wt 220 lb (99.791 kg)  BMI 29.03 kg/m2  SpO2 100%   Eyes: Conjunctivae are normal.  Pulmonary/Chest: Effort normal.  Genitourinary:    Circumcised.  Tiny pink papule on the corona. Not consistent with HSV or HPV. Appears benign, perhaps with some digital traumatic excoriation. Normal pearly papules also noted.  Neurological: He is alert and oriented to person, place, and time.  Psychiatric: He has a normal mood and affect. His speech is normal and behavior is normal.          Assessment & Plan:  1. Routine screening for STI (sexually transmitted infection) Reassured that the current bumps do not appear to be indicative of STI.  - GC/Chlamydia Probe Amp - HSV(herpes  simplex vrs) 1+2 ab-IgG - HIV antibody - RPR  Declines flu vaccine.  Fernande Bras, PA-C Physician Assistant-Certified [facility]

## 2013-12-18 NOTE — Patient Instructions (Signed)
I will contact you with your lab results as soon as they are available.   If you have not heard from me in 2 weeks, please contact me.  The fastest way to get your results is to register for My Chart (see the instructions on the last page of this printout).   

## 2013-12-19 LAB — GC/CHLAMYDIA PROBE AMP
CT Probe RNA: NEGATIVE
GC Probe RNA: NEGATIVE

## 2013-12-19 LAB — HSV(HERPES SIMPLEX VRS) I + II AB-IGG
HSV 1 Glycoprotein G Ab, IgG: <0.1 IV
HSV 2 Glycoprotein G Ab, IgG: <0.1 IV

## 2013-12-19 LAB — HIV ANTIBODY (ROUTINE TESTING W REFLEX): HIV: NONREACTIVE

## 2013-12-19 LAB — RPR

## 2015-04-03 ENCOUNTER — Ambulatory Visit (INDEPENDENT_AMBULATORY_CARE_PROVIDER_SITE_OTHER): Payer: Self-pay | Admitting: Physician Assistant

## 2015-04-03 VITALS — BP 124/88 | HR 100 | Temp 100.4°F | Resp 19 | Ht 75.0 in | Wt 250.0 lb

## 2015-04-03 DIAGNOSIS — R509 Fever, unspecified: Secondary | ICD-10-CM

## 2015-04-03 DIAGNOSIS — J029 Acute pharyngitis, unspecified: Secondary | ICD-10-CM

## 2015-04-03 LAB — POCT INFLUENZA A/B
INFLUENZA B, POC: NEGATIVE
Influenza A, POC: NEGATIVE

## 2015-04-03 LAB — POCT RAPID STREP A (OFFICE): Rapid Strep A Screen: NEGATIVE

## 2015-04-03 MED ORDER — IBUPROFEN 200 MG PO TABS
800.0000 mg | ORAL_TABLET | Freq: Once | ORAL | Status: AC
  Administered 2015-04-03: 800 mg via ORAL

## 2015-04-03 MED ORDER — AMOXICILLIN 875 MG PO TABS: 875.0000 mg | ORAL_TABLET | Freq: Two times a day (BID) | ORAL | Status: AC

## 2015-04-03 NOTE — Progress Notes (Signed)
[facility] [address], [place] [address] [phone]  Date:  04/03/2015   Name:  Randy Barnett   DOB:  02-Sep-1995   MRN:  [id number]  PCP:  No PCP Per Patient    Chief Complaint: Sore Throat; Generalized Body Aches; Chills; and Fever   History of Present Illness:  This is a 20 y.o. male who is presenting with sore throat, body aches, chills and fever x 2.5 days.  Cough: no SOB/wheezing: no Nasal congestion: yes Otalgia: no Sore throat: yes Fever/chills: yes Aggravating/alleviating factors: ibuprofen, night time cold and flu - temporary relief. History of asthma: no History of env allergies: no Tobacco use: rare Did not get the flu shot this year. No sick contacts. He does not have insurance and is very worried about finances.  Review of Systems:  Review of Systems See HPI  There are no active problems to display for this patient.   Prior to Admission medications   Not on File    No Known Allergies  History reviewed. No pertinent past surgical history.  Social History  Substance Use Topics  . Smoking status: Former Games developer  . Smokeless tobacco: Current User    Types: Snuff, Chew  . Alcohol Use: No     Comment: previously 1-1.5 cases per week    History reviewed. No pertinent family history.  Medication list has been reviewed and updated.  Physical Examination:  Physical Exam  Constitutional: He is oriented to person, place, and time. He appears well-developed and well-nourished. No distress.  HENT:  Head: Normocephalic and atraumatic.  Right Ear: Hearing, tympanic membrane, external ear and ear canal normal.  Left Ear: Hearing, tympanic membrane, external ear and ear canal normal.  Nose: Nose normal.  Mouth/Throat: Uvula is midline and mucous membranes are normal. Oropharyngeal exudate, posterior oropharyngeal edema and posterior oropharyngeal erythema present. No tonsillar abscesses.  Tonsils 3+  Eyes: Conjunctivae  and lids are normal. Right eye exhibits no discharge. Left eye exhibits no discharge. No scleral icterus.  Cardiovascular: Regular rhythm, normal heart sounds and normal pulses.   Mild tachycardia  Pulmonary/Chest: Effort normal and breath sounds normal. No respiratory distress. He has no wheezes. He has no rhonchi. He has no rales.  Musculoskeletal: Normal range of motion.  Lymphadenopathy:       Head (right side): No submental, no submandibular and no tonsillar adenopathy present.       Head (left side): No submental, no submandibular and no tonsillar adenopathy present.    He has no cervical adenopathy.  Neurological: He is alert and oriented to person, place, and time.  Skin: Skin is warm, dry and intact. No lesion and no rash noted.  Psychiatric: He has a normal mood and affect. His speech is normal and behavior is normal. Thought content normal.   BP 124/88 mmHg  Pulse 125  Temp(Src) 103 F (39.4 C) (Oral)  Resp 19  Ht 6\' 3"  (1.905 m)  Wt 250 lb (113.399 kg)  BMI 31.25 kg/m2  SpO2 98%  Results for orders placed or performed in visit on 04/03/15  POCT Influenza A/B  Result Value Ref Range   Influenza A, POC Negative Negative   Influenza B, POC Negative Negative  POCT rapid strep A  Result Value Ref Range   Rapid Strep A Screen Negative Negative    Assessment and Plan:  1. Acute pharyngitis, unspecified etiology 2. Fever 3. Sore throat Flu negative and rapid strep negative. Due to large tonsils  and large amount of exudate will go ahead and treat with amoxicillin. Ibuprofen/tylenol for pain. Return if still having fevers in 48-72 hours. Gave work note. - amoxicillin (AMOXIL) 875 MG tablet; Take 1 tablet (875 mg total) by mouth 2 (two) times daily.  Dispense: 20 tablet; Refill: 0 - ibuprofen (ADVIL,MOTRIN) tablet 800 mg; Take 4 tablets (800 mg total) by mouth once. - POCT Influenza A/B - POCT rapid strep A - Culture, Group A Strep   Roswell MinersNicole V. Dyke BrackettBush, PA-C, MHS [facility]  04/03/2015

## 2015-04-03 NOTE — Patient Instructions (Addendum)
Take two tabs amoxicillin twice a day for 10 days. Ibuprofen/tylenol for pain and fever. Drink plenty of water and get plenty of rest. Return if symptoms are not improving in 72 hours, return to clinic.

## 2015-04-05 LAB — CULTURE, GROUP A STREP: ORGANISM ID, BACTERIA: NORMAL

## 2015-04-09 ENCOUNTER — Telehealth: Payer: Self-pay

## 2015-04-09 NOTE — Telephone Encounter (Signed)
Pt LM on lab VM. LMOM again letting him know that his throat culture was negative and to CB if he is not well

## 2015-04-16 IMAGING — CR DG FINGER RING 2+V*L*
1 series · 1 of 1 positions shown · non-contrast
Comparison: None.

CLINICAL DATA: Smashed fourth digit yesterday

EXAM:
LEFT RING FINGER 2+V

[PA]
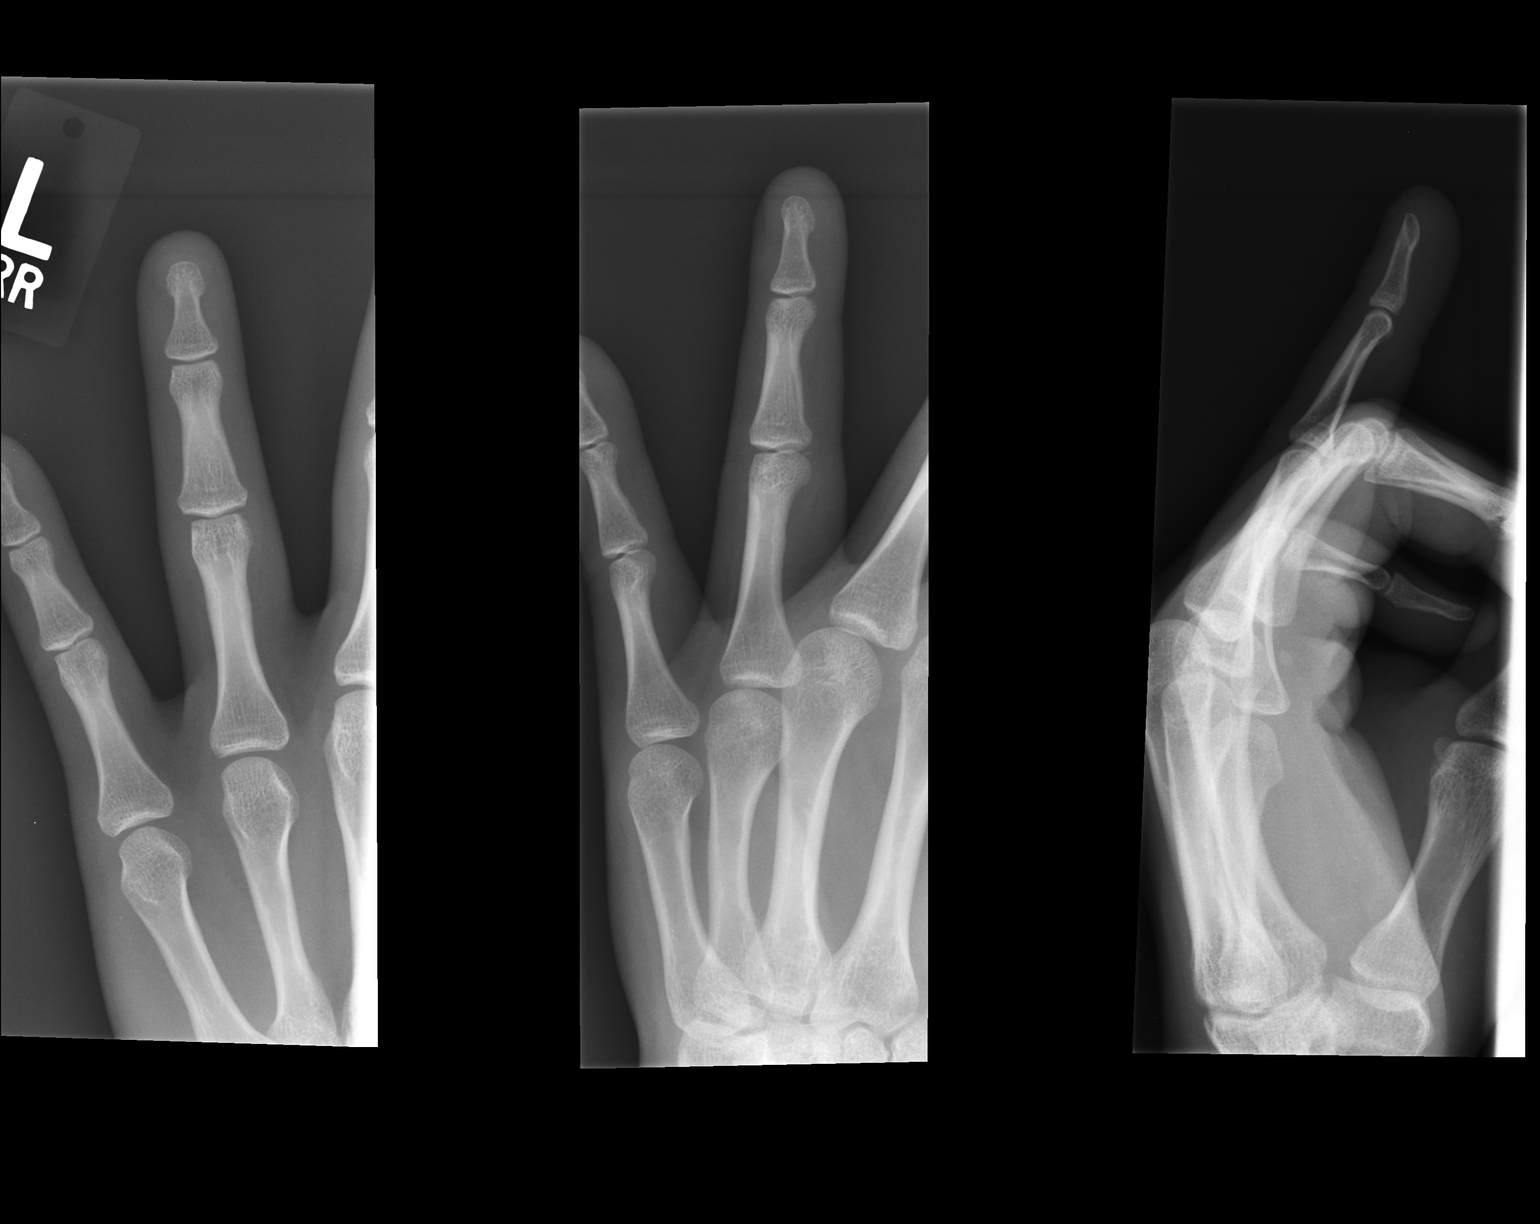

[1 of 1 positions shown; findings below may reference images not displayed]

FINDINGS: There is no evidence of fracture or dislocation. There is no
evidence of arthropathy or other focal bone abnormality. Soft
tissues are unremarkable.
IMPRESSION: Negative.

## 2015-11-11 ENCOUNTER — Emergency Department (HOSPITAL_COMMUNITY): Payer: Self-pay

## 2015-11-11 ENCOUNTER — Emergency Department (HOSPITAL_COMMUNITY)
Admission: EM | Admit: 2015-11-11 | Discharge: 2015-11-11 | Disposition: A | Discharge status: Home / Self Care | Payer: Self-pay | Attending: Emergency Medicine | Admitting: Emergency Medicine

## 2015-11-11 ENCOUNTER — Encounter (HOSPITAL_COMMUNITY): Payer: Self-pay | Admitting: Emergency Medicine

## 2015-11-11 DIAGNOSIS — M722 Plantar fascial fibromatosis: Secondary | ICD-10-CM | POA: Insufficient documentation

## 2015-11-11 DIAGNOSIS — F17290 Nicotine dependence, other tobacco product, uncomplicated: Secondary | ICD-10-CM | POA: Insufficient documentation

## 2015-11-11 DIAGNOSIS — M79671 Pain in right foot: Secondary | ICD-10-CM

## 2015-11-11 MED ORDER — DICLOFENAC SODIUM 50 MG PO TBEC: 50.0000 mg | DELAYED_RELEASE_TABLET | Freq: Two times a day (BID) | ORAL | 0 refills | Status: DC

## 2015-11-11 MED FILL — DICLOFENAC SOD EC 50 MG TAB: 50 | 10 days supply | Qty: 20 | Fill #0

## 2015-11-11 NOTE — ED Provider Notes (Signed)
[facility]-EMERGENCY DEPT Provider Note   CSN: [id number] Arrival date & time: 11/11/15  1132   By signing my name below, I, Soijett Blue, attest that this documentation has been prepared under the direction and in the presence of Langston MaskerKaren Darcel Zick, PA-C Electronically Signed: Soijett Blue, ED Scribe. 11/11/15. 1:24 PM.    History   Chief Complaint Chief Complaint  Patient presents with  . Foot Pain    HPI Randy Barnett is a 20 y.o. male who presents to the Emergency Department complaining of gradually worsening right foot pain onset 2 weeks. Pt states that his right foot pain is worsened with. 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 Pt states that he has foot pain with excessive weight bearing and ambulation. Pt reports that he was informed by his recruiter that he will need an xray for his symptoms. He notes that he has tried ice, heat compress, and epsom salt, with no relief of his symptoms. He denies color change, wound, rash, swelling, and any other symptoms.     The history is provided by the patient. No language interpreter was used.    History reviewed. No pertinent past medical history.  There are no active problems to display for this patient.   History reviewed. No pertinent surgical history.     Home Medications    Prior to Admission medications   Medication Sig Start Date End Date Taking? Authorizing Provider  diclofenac (VOLTAREN) 50 MG EC tablet Take 1 tablet (50 mg total) by mouth 2 (two) times daily. 11/11/15   Elson AreasLeslie K Lang Zingg, PA-C    Family History No family history on file.  Social History Social History  Substance Use Topics  . Smoking status: Former Games developermoker  . Smokeless tobacco: Current User    Types: Snuff, Chew  . Alcohol use No     Comment: previously 1-1.5 cases per week     Allergies   Review of patient's allergies indicates no known allergies.   Review of Systems Review of  Systems   Physical Exam Updated Vital Signs BP 148/87 (BP Location: Left Arm)   Pulse 65   Temp 99 F (37.2 C) (Oral)   Resp 18   Ht 6\' 1"  (1.854 m)   Wt 239 lb (108.4 kg)   SpO2 100%   BMI 31.53 kg/m   Physical Exam  Constitutional: He is oriented to person, place, and time. He appears well-developed and well-nourished. No distress.  HENT:  Head: Normocephalic and atraumatic.  Eyes: EOM are normal.  Neck: Neck supple.  Cardiovascular: Normal rate.   Pulmonary/Chest: Effort normal. No respiratory distress.  Abdominal: He exhibits no distension.  Musculoskeletal: Normal range of motion.       Right hand: He exhibits tenderness. He exhibits no swelling.  Tender to dorsal fifth metatarsal area. No swelling. NVI and neurosensory intact.  Neurological: He is alert and oriented to person, place, and time.  Skin: Skin is warm and dry.  Psychiatric: He has a normal mood and affect. His behavior is normal.  Nursing note and vitals reviewed.    ED Treatments / Results  DIAGNOSTIC STUDIES: Oxygen Saturation is 100% on RA, nl by my interpretation.    COORDINATION OF CARE: 12:56 PM Discussed treatment plan with pt at bedside which includes right foot xray and voltaren and pt agreed to plan.    Radiology Dg Foot Complete Right  Result Date: 11/11/2015 CLINICAL DATA:  Lateral  foot pain/tenderness EXAM: RIGHT FOOT COMPLETE - 3+ VIEW COMPARISON:  None. FINDINGS: No fracture or dislocation is seen. The joint spaces are preserved. Visualized soft tissues are within normal limits. IMPRESSION: No fracture or dislocation is seen. Electronically Signed   By: Charline BillsSriyesh  Krishnan M.D.   On: 11/11/2015 13:16    Procedures Procedures (including critical care time)  Medications Ordered in ED Medications - No data to display   Initial Impression / Assessment and Plan / ED Course  I have reviewed the triage vital signs and the nursing notes.  Pertinent imaging results that were available  during my care of the patient were reviewed by me and considered in my medical decision making (see chart for details).  Clinical Course      Patient X-Ray negative for obvious fracture or dislocation.  I suspect that is probable plantar fascitis. Advised pt on use of orthotics. Will be discharged home with Voltaren. Conservative therapy recommended and discussed. Patient will be discharged home & is agreeable with above plan. Returns precautions discussed. Pt appears safe for discharge.  Final Clinical Impressions(s) / ED Diagnoses   Final diagnoses:  Foot pain, right  Plantar fasciitis of left foot    New Prescriptions New Prescriptions   DICLOFENAC (VOLTAREN) 50 MG EC TABLET    Take 1 tablet (50 mg total) by mouth 2 (two) times daily.    I personally performed the services in this documentation, which was scribed in my presence.  The recorded information has been reviewed and considered.   Barnet PallKaren SofiaPAC. An After Visit Summary was printed and given to the patient.   Lonia SkinnerLeslie K IvanhoeSofia, PA-C 11/11/15 1633    Benjiman CoreNathan Pickering, MD 11/11/15 [id number]

## 2015-11-11 NOTE — ED Notes (Signed)
Bed: WA26 Expected date:  Expected time:  Means of arrival:  Comments: 

## 2015-11-11 NOTE — ED Notes (Signed)
Pt reports rt anterior lateral foot pain and tenderness, denies swelling or any other s/s, onset of injury unknown, states that he has been training to go into the [employer] and his recruiter wanted him to have his foot looked at and him cleared before he continues.

## 2015-11-11 NOTE — ED Triage Notes (Signed)
Pt complaint of right foot pain worsening over past few weeks with activity. Pt reports aggressive training for [employer] but denies injury. Absent for swelling/redness.

## 2017-03-18 ENCOUNTER — Emergency Department (HOSPITAL_COMMUNITY)
Admission: EM | Admit: 2017-03-18 | Discharge: 2017-03-18 | Disposition: A | Discharge status: Home / Self Care | Payer: Self-pay | Attending: Emergency Medicine | Admitting: Emergency Medicine

## 2017-03-18 ENCOUNTER — Encounter (HOSPITAL_COMMUNITY): Payer: Self-pay | Admitting: Emergency Medicine

## 2017-03-18 DIAGNOSIS — J028 Acute pharyngitis due to other specified organisms: Secondary | ICD-10-CM

## 2017-03-18 DIAGNOSIS — Z87891 Personal history of nicotine dependence: Secondary | ICD-10-CM | POA: Insufficient documentation

## 2017-03-18 DIAGNOSIS — J029 Acute pharyngitis, unspecified: Secondary | ICD-10-CM | POA: Insufficient documentation

## 2017-03-18 MED ORDER — AMOXICILLIN 500 MG PO CAPS: 500.0000 mg | ORAL_CAPSULE | Freq: Three times a day (TID) | ORAL | 0 refills | Status: DC

## 2017-03-18 MED ORDER — IBUPROFEN 600 MG PO TABS: 600.0000 mg | ORAL_TABLET | Freq: Three times a day (TID) | ORAL | 0 refills | Status: DC | PRN

## 2017-03-18 NOTE — ED Notes (Signed)
ED Provider at bedside. 

## 2017-03-18 NOTE — ED Triage Notes (Signed)
Pt states for the last week he has been sore throat painful with swallowing and states he feels like something is struck in his throat. Pt states she has a growth on his penis that has been there for 5 years. Pt also states he has been having pain with BM and small amount of red blood with stools.

## 2017-03-18 NOTE — ED Notes (Signed)
Pt verbalized understanding of discharge instructions and denies any further questions at this time.   

## 2017-03-18 NOTE — ED Provider Notes (Signed)
[facility]   CSN: [id number] Arrival date & time: 03/18/17  1018     History   Chief Complaint Chief Complaint  Patient presents with  . Sore Throat    HPI Randy AlpersWilliam Barnett is a 21 y.o. male.  HPI Patient is a 21 year old male who presents the emergency department complaining of painful swelling over the past several days.  He has a long-standing history of recurrent sore throat and strep throat.  At one point he was recommended that he would likely need tonsillectomy.  He has never seen ear nose and throat surgeon.  No fevers or chills.  No cough or congestion.  Is able to eat and drink.  He also reports an area of irritation on his left penis which is been present for 5 years.  It is painless.  He also reports the area of irritation around his rectum over the past several months.  He does masturbate frequently and states he uses a soft blanket.  He does not use much lubrication.  He denies diarrhea.  Denies rectal intercourse.   History reviewed. No pertinent past medical history.  There are no active problems to display for this patient.   No past surgical history on file.     Home Medications    Prior to Admission medications   Medication Sig Start Date End Date Taking? Authorizing Provider  amoxicillin (AMOXIL) 500 MG capsule Take 1 capsule (500 mg total) by mouth 3 (three) times daily. 03/18/17   Azalia Bilisampos, Beula Joyner, MD  diclofenac (VOLTAREN) 50 MG EC tablet Take 1 tablet (50 mg total) by mouth 2 (two) times daily. 11/11/15   Elson AreasSofia, Leslie K, PA-C  ibuprofen (ADVIL,MOTRIN) 600 MG tablet Take 1 tablet (600 mg total) by mouth every 8 (eight) hours as needed. 03/18/17   Azalia Bilisampos, Lorin Hauck, MD    Family History No family history on file.  Social History Social History   Tobacco Use  . Smoking status: Former Games developermoker  . Smokeless tobacco: Current User    Types: Snuff, Chew  Substance Use Topics  . Alcohol use: No   Comment: previously 1-1.5 cases per week  . Drug use: No     Allergies   Patient has no known allergies.   Review of Systems Review of Systems  All other systems reviewed and are negative.    Physical Exam Updated Vital Signs BP (!) 159/96 (BP Location: Right Arm)   Pulse 72   Temp 98.2 F (36.8 C) (Oral)   Resp 16   SpO2 100%   Physical Exam  Constitutional: He is oriented to person, place, and time. He appears well-developed and well-nourished.  HENT:  Head: Normocephalic.  Bilateral tonsillar erythema without exudate.  Mild hypertrophied tonsils with cryptic appearing tonsils bilaterally.  Tolerating secretions.  Oral airway patent.  Tongue and oral cavity otherwise normal.  Eyes: EOM are normal.  Neck: Normal range of motion.  Pulmonary/Chest: Effort normal.  Abdominal: He exhibits no distension.  Genitourinary:  Genitourinary Comments: Scabbed area of the shaft of his distal left penis consistent with chronic abrasion.  Small healing fissure on rectal examination  Musculoskeletal: Normal range of motion.  Neurological: He is alert and oriented to person, place, and time.  Psychiatric: He has a normal mood and affect.  Nursing note and vitals reviewed.    ED Treatments / Results  Labs (all labs ordered are listed, but only abnormal results are displayed) Labs Reviewed - No data to display  EKG  EKG Interpretation None       Radiology No results found.  Procedures Procedures (including critical care time)  Medications Ordered in ED Medications - No data to display   Initial Impression / Assessment and Plan / ED Course  I have reviewed the triage vital signs and the nursing notes.  Pertinent labs & imaging results that were available during my care of the patient were reviewed by me and considered in my medical decision making (see chart for details).     Referral to primary care doctor.  Antibiotics for what appears to be bacterial  pharyngitis.  Likely chronic abrasion from frequent masturbation with a blanket.  Small healing fissure.  No indication for additional treatment in the emergency department.  Medical screening examination complete  Final Clinical Impressions(s) / ED Diagnoses   Final diagnoses:  Acute pharyngitis due to other specified organisms    ED Discharge Orders        Ordered    amoxicillin (AMOXIL) 500 MG capsule  3 times daily     03/18/17 1143    ibuprofen (ADVIL,MOTRIN) 600 MG tablet  Every 8 hours PRN     03/18/17 1143       Azalia Bilisampos, Tita Terhaar, MD 03/18/17 1149

## 2017-06-21 ENCOUNTER — Ambulatory Visit (HOSPITAL_COMMUNITY)
Admission: EM | Admit: 2017-06-21 | Discharge: 2017-06-21 | Disposition: A | Discharge status: Home / Self Care | Payer: Self-pay | Attending: Emergency Medicine | Admitting: Emergency Medicine

## 2017-06-21 ENCOUNTER — Encounter (HOSPITAL_COMMUNITY): Payer: Self-pay | Admitting: Emergency Medicine

## 2017-06-21 DIAGNOSIS — Z87891 Personal history of nicotine dependence: Secondary | ICD-10-CM | POA: Insufficient documentation

## 2017-06-21 DIAGNOSIS — Z202 Contact with and (suspected) exposure to infections with a predominantly sexual mode of transmission: Secondary | ICD-10-CM | POA: Insufficient documentation

## 2017-06-21 DIAGNOSIS — Z113 Encounter for screening for infections with a predominantly sexual mode of transmission: Secondary | ICD-10-CM | POA: Insufficient documentation

## 2017-06-21 DIAGNOSIS — L299 Pruritus, unspecified: Secondary | ICD-10-CM

## 2017-06-21 DIAGNOSIS — L298 Other pruritus: Secondary | ICD-10-CM | POA: Insufficient documentation

## 2017-06-21 MED ORDER — HYDROXYZINE HCL 25 MG PO TABS: 25.0000 mg | ORAL_TABLET | Freq: Three times a day (TID) | ORAL | 0 refills | Status: DC | PRN

## 2017-06-21 NOTE — ED Triage Notes (Signed)
Pt here for STD screening and sts some pain with heavy lifting

## 2017-06-21 NOTE — Discharge Instructions (Signed)
May use vistaril as needed for itching, may cause drowsiness so do not take prior to driving or working with machinery. I would recommend use of Naproxen 500mg  twice a day, take with food, for back and knee pain. Will call you with any positive testing from your screens tonight. Please establish with a primary care provider for further management if your symptoms persist.

## 2017-06-21 NOTE — ED Provider Notes (Signed)
[facility]    CSN: [id number] Arrival date & time: 06/21/17  1847     History   Chief Complaint Chief Complaint  Patient presents with  . Exposure to STD    HPI Randy Barnett is a 22 y.o. male.   Randy Barnett presents with multiple concerns. Primary concern is that he has had itching to his inner thighs and pelvis which started after receiving unprotected oral sex with a new partner. He states at the same time his washing machine had broken causing laundry issues. Laundry machine has since been fixed, itching has since significantly improved but still present. He states he is concerned about herpes. Denies any penile lesions or discharge. Denies  Urinary symptoms. Denies any previous stds. States also has intermittent shooting pain from abdomen into scrotum which occurs throughout the day while sitting at work. Without redness or swelling to testicles or scrotum. No fevers. Does not have a primary care provider. Without contributing medical history.    ROS per HPI.      History reviewed. No pertinent past medical history.  There are no active problems to display for this patient.   History reviewed. No pertinent surgical history.     Home Medications    Prior to Admission medications   Medication Sig Start Date End Date Taking? Authorizing Provider  aspirin EC 81 MG tablet Take 81 mg by mouth as needed.    [provider]  hydrOXYzine (ATARAX/VISTARIL) 25 MG tablet Take 1 tablet (25 mg total) by mouth every 8 (eight) hours as needed for itching. 06/21/17   Georgetta HaberBurky, Krisann Mckenna B, NP    Family History History reviewed. No pertinent family history.  Social History Social History   Tobacco Use  . Smoking status: Former Games developermoker  . Smokeless tobacco: Current User    Types: Snuff, Chew  Substance Use Topics  . Alcohol use: No    Comment: previously 1-1.5 cases per week  . Drug use: No     Allergies   Patient has no known allergies.   Review of  Systems Review of Systems   Physical Exam Triage Vital Signs ED Triage Vitals [06/21/17 2016]  Enc Vitals Group     BP 139/86     Pulse Rate 78     Resp 18     Temp 98.8 F (37.1 C)     Temp Source Oral     SpO2 100 %     Weight      Height      Head Circumference      Peak Flow      Pain Score      Pain Loc      Pain Edu?      Excl. in GC?    No data found.  Updated Vital Signs BP 139/86 (BP Location: Left Arm)   Pulse 78   Temp 98.8 F (37.1 C) (Oral)   Resp 18   SpO2 100%   Visual Acuity Right Eye Distance:   Left Eye Distance:   Bilateral Distance:    Right Eye Near:   Left Eye Near:    Bilateral Near:     Physical Exam  Constitutional: He is oriented to person, place, and time. He appears well-developed and well-nourished.  Cardiovascular: Normal rate and regular rhythm.  Pulmonary/Chest: Effort normal and breath sounds normal.  Abdominal: Soft. Bowel sounds are normal. There is no tenderness. Hernia confirmed negative in the right inguinal area and confirmed negative in the left inguinal  area.  Genitourinary: Testes normal and penis normal. Right testis shows no swelling and no tenderness. Left testis shows no swelling and no tenderness. Circumcised. No penile erythema or penile tenderness. No discharge found.  Genitourinary Comments: Without lesions or sores to scrotum or penis  Lymphadenopathy: No inguinal adenopathy noted on the right or left side.  Neurological: He is alert and oriented to person, place, and time.  Skin: Skin is warm and dry.  Inner thighs with pinkness noted and a few scattered abrasive markings to hair follicles; without lesions, scabbing, sores, or rash; visible where patient has been scratching to inner thighs; pubic hair with a few raised red follicles without pustule or lesions     UC Treatments / Results  Labs (all labs ordered are listed, but only abnormal results are displayed) Labs Reviewed  RPR  HIV ANTIBODY (ROUTINE  TESTING)  URINE CYTOLOGY ANCILLARY ONLY    EKG None Radiology No results found.  Procedures Procedures (including critical care time)  Medications Ordered in UC Medications - No data to display   Initial Impression / Assessment and Plan / UC Course  I have reviewed the triage vital signs and the nursing notes.  Pertinent labs & imaging results that were available during my care of the patient were reviewed by me and considered in my medical decision making (see chart for details).     Vistaril as needed for itching. Full STD panel provided at this time. Will notify of any positive findings and if any changes to treatment are needed.  Encouraged follow up and establish with PCP as appears to become quite anxious about medical concerns. Patient verbalized understanding and agreeable to plan.    Final Clinical Impressions(s) / UC Diagnoses   Final diagnoses:  Pruritus  Screen for STD (sexually transmitted disease)    ED Discharge Orders        Ordered    hydrOXYzine (ATARAX/VISTARIL) 25 MG tablet  Every 8 hours PRN     06/21/17 2055       Controlled Substance Prescriptions [place] Controlled Substance Registry consulted? Not Applicable   Georgetta Haber, NP 06/21/17 2140

## 2017-06-22 LAB — RPR: RPR Ser Ql: NONREACTIVE

## 2017-06-22 LAB — URINE CYTOLOGY ANCILLARY ONLY
CHLAMYDIA, DNA PROBE: NEGATIVE
Neisseria Gonorrhea: NEGATIVE
Trichomonas: NEGATIVE

## 2017-06-23 LAB — HIV ANTIBODY (ROUTINE TESTING W REFLEX): HIV SCREEN 4TH GENERATION: NONREACTIVE

## 2018-02-17 ENCOUNTER — Ambulatory Visit (HOSPITAL_COMMUNITY)
Admission: EM | Admit: 2018-02-17 | Discharge: 2018-02-17 | Disposition: A | Discharge status: Home / Self Care | Payer: Self-pay | Attending: Emergency Medicine | Admitting: Emergency Medicine

## 2018-02-17 ENCOUNTER — Encounter (HOSPITAL_COMMUNITY): Payer: Self-pay | Admitting: Emergency Medicine

## 2018-02-17 ENCOUNTER — Other Ambulatory Visit: Payer: Self-pay

## 2018-02-17 DIAGNOSIS — F17210 Nicotine dependence, cigarettes, uncomplicated: Secondary | ICD-10-CM | POA: Insufficient documentation

## 2018-02-17 DIAGNOSIS — R21 Rash and other nonspecific skin eruption: Secondary | ICD-10-CM | POA: Insufficient documentation

## 2018-02-17 DIAGNOSIS — Z7982 Long term (current) use of aspirin: Secondary | ICD-10-CM | POA: Insufficient documentation

## 2018-02-17 DIAGNOSIS — Z79899 Other long term (current) drug therapy: Secondary | ICD-10-CM | POA: Insufficient documentation

## 2018-02-17 DIAGNOSIS — M79672 Pain in left foot: Secondary | ICD-10-CM | POA: Insufficient documentation

## 2018-02-17 MED ORDER — CLOTRIMAZOLE 1 % EX CREA: TOPICAL_CREAM | CUTANEOUS | 0 refills | Status: DC

## 2018-02-17 MED ORDER — MELOXICAM 7.5 MG PO TABS: 7.5000 mg | ORAL_TABLET | Freq: Every day | ORAL | 0 refills | Status: DC

## 2018-02-17 NOTE — ED Provider Notes (Signed)
[facility]    CSN: [id number] Arrival date & time: 02/17/18  1145     History   Chief Complaint Chief Complaint  Patient presents with  . Eczema  . Foot Pain    left    HPI Randy Barnett is a 22 y.o. male.   Randy Barnett presents with complaints of left foot pain which started approximately 2 months ago but has been worsening. No injury. Worse when walking a lot while at work. No redness or swelling. Has taken tylenol which hasn't helped. No numbness or tingling. Denies any previous similar. No swelling. States also has small rash to scrotum, had been itchy but has improved. Two areas to torso as well. Has been present for approximately 1 month. Tried eczema cream which hasn't helped. States he would like std screening, hasn't a new partner in a few months. No penile symptoms, no dysuria. Does not have a PCP. Without contributing medical history. Has had itching skin and rash in the past, did not take vistaril as previously prescribed.     ROS per HPI.      History reviewed. No pertinent past medical history.  There are no active problems to display for this patient.   History reviewed. No pertinent surgical history.     Home Medications    Prior to Admission medications   Medication Sig Start Date End Date Taking? Authorizing Provider  aspirin EC 81 MG tablet Take 81 mg by mouth as needed.    [provider]  clotrimazole (LOTRIMIN) 1 % cream Apply to affected area 2 times daily 02/17/18   Linus Mako B, NP  hydrOXYzine (ATARAX/VISTARIL) 25 MG tablet Take 1 tablet (25 mg total) by mouth every 8 (eight) hours as needed for itching. 06/21/17   Georgetta Haber, NP  meloxicam (MOBIC) 7.5 MG tablet Take 1 tablet (7.5 mg total) by mouth daily. 02/17/18   Georgetta Haber, NP    Family History History reviewed. No pertinent family history.  Social History Social History   Tobacco Use  . Smoking status: Current Every Day Smoker    Packs/day:  0.50    Types: Cigarettes  . Smokeless tobacco: Current User    Types: Snuff, Chew  Substance Use Topics  . Alcohol use: No    Comment: previously 1-1.5 cases per week  . Drug use: No     Allergies   Patient has no known allergies.   Review of Systems Review of Systems   Physical Exam Triage Vital Signs ED Triage Vitals  Enc Vitals Group     BP --      Pulse Rate 02/17/18 1253 84     Resp --      Temp 02/17/18 1253 98.3 F (36.8 C)     Temp Source 02/17/18 1253 Oral     SpO2 02/17/18 1253 98 %     Weight --      Height --      Head Circumference --      Peak Flow --      Pain Score 02/17/18 1250 3     Pain Loc --      Pain Edu? --      Excl. in GC? --    No data found.  Updated Vital Signs Pulse 84   Temp 98.3 F (36.8 C) (Oral)   SpO2 98%    Physical Exam  Constitutional: He is oriented to person, place, and time. He appears well-developed and well-nourished.  Cardiovascular: Normal  rate and regular rhythm.  Pulmonary/Chest: Effort normal and breath sounds normal.  Genitourinary: Penis normal.     Genitourinary Comments: Slightly red raised rash to distal scrotum, slightly posterior; no vesicles or papules; no drainage or open skin; no ulcerations; no surrounding skin redness, fluctuance or induration; scrotum otherwise without swelling or redness; non tender   Musculoskeletal:       Left foot: There is normal range of motion, no tenderness, no bony tenderness, no swelling, normal capillary refill, no crepitus, no deformity and no laceration.  Complaints of pain to second toe at MTP joint; no pain on palpation or with flexion or extension; patient uses his big toe to press the second toe down in flexion and states that is how he can reproduce pain; no redness, no swelling  Neurological: He is alert and oriented to person, place, and time.  Skin: Skin is warm and dry.  Small red raised rash in circular shape to right chest as well as to right mid abdomen;  non tender; blanching; no papules, vesicles; no drainage     UC Treatments / Results  Labs (all labs ordered are listed, but only abnormal results are displayed) Labs Reviewed  HEPATITIS C ANTIBODY  RPR  HIV ANTIBODY (ROUTINE TESTING W REFLEX)  URINE CYTOLOGY ANCILLARY ONLY    EKG None  Radiology No results found.  Procedures Procedures (including critical care time)  Medications Ordered in UC Medications - No data to display  Initial Impression / Assessment and Plan / UC Course  I have reviewed the triage vital signs and the nursing notes.  Pertinent labs & imaging results that were available during my care of the patient were reviewed by me and considered in my medical decision making (see chart for details).     Question fungal rash, with lotrimin provided. Std screening completed. No acute findings to left foot, no injury or trauma, imaging deferred. Ice, elevation, nsaids for pain. Post op shoe offered, patient declined. Recommend follow up with podiatry as needed. Encouraged establish with a PCP. Safe sex practices encouraged. Patient verbalized understanding and agreeable to plan.  Ambulatory out of clinic without difficulty.   Final Clinical Impressions(s) / UC Diagnoses   Final diagnoses:  Foot pain, left  Rash and nonspecific skin eruption     Discharge Instructions     Ice and elevation of your foot at the end of the day.  Use of provided shoe can be helpful to provide better support for your foot.  Meloxicam daily to help with pain.  May use provided cream, light amount 1-2 times a day to help with rash.  Follow up with dermatology for any persistent rash.  Please follow up with podiatry for persistent foot pain.  Please establish with a primary care provider for recheck and referrals as needed.  Please withhold from intercourse for the next week. Please use condoms to prevent STD's.     ED Prescriptions    Medication Sig Dispense Auth. Provider     clotrimazole (LOTRIMIN) 1 % cream Apply to affected area 2 times daily 15 g ,  B, NP   meloxicam (MOBIC) 7.5 MG tablet Take 1 tablet (7.5 mg total) by mouth daily. 20 tablet Georgetta Haber,  B, NP     Controlled Substance Prescriptions [place] Controlled Substance Registry consulted? Not Applicable   Georgetta Haber,  B, NP 02/17/18 2228

## 2018-02-17 NOTE — ED Notes (Signed)
Refused wooden shoe, natalie burky, np aware

## 2018-02-17 NOTE — Discharge Instructions (Addendum)
Ice and elevation of your foot at the end of the day.  Use of provided shoe can be helpful to provide better support for your foot.  Meloxicam daily to help with pain.  May use provided cream, light amount 1-2 times a day to help with rash.  Follow up with dermatology for any persistent rash.  Please follow up with podiatry for persistent foot pain.  Please establish with a primary care provider for recheck and referrals as needed.  Please withhold from intercourse for the next week. Please use condoms to prevent STD's.

## 2018-02-17 NOTE — ED Triage Notes (Addendum)
Pt reports left foot pain x2 months.  He states it starts in his great toe and radiates up his foot.  He also reports dry skin patches to his right breast, his right abdomen, his right scrotum and both hands on his fingertips.  Pt would also like to be screened for STD's, no problems noted.

## 2018-02-18 LAB — URINE CYTOLOGY ANCILLARY ONLY
Chlamydia: NEGATIVE
NEISSERIA GONORRHEA: NEGATIVE
Trichomonas: NEGATIVE

## 2018-02-18 LAB — RPR: RPR Ser Ql: NONREACTIVE

## 2018-02-18 LAB — HIV ANTIBODY (ROUTINE TESTING W REFLEX): HIV Screen 4th Generation wRfx: NONREACTIVE

## 2018-02-18 LAB — HEPATITIS C ANTIBODY: HCV Ab: <0.1 s/co ratio (ref 0.0–0.9)

## 2018-06-09 ENCOUNTER — Encounter (HOSPITAL_COMMUNITY): Payer: Self-pay | Admitting: Emergency Medicine

## 2018-06-09 ENCOUNTER — Other Ambulatory Visit: Payer: Self-pay

## 2018-06-09 ENCOUNTER — Ambulatory Visit (HOSPITAL_COMMUNITY)
Admission: EM | Admit: 2018-06-09 | Discharge: 2018-06-09 | Disposition: A | Discharge status: Home / Self Care | Payer: Self-pay | Attending: Family Medicine | Admitting: Family Medicine

## 2018-06-09 DIAGNOSIS — R69 Illness, unspecified: Principal | ICD-10-CM

## 2018-06-09 DIAGNOSIS — J111 Influenza due to unidentified influenza virus with other respiratory manifestations: Secondary | ICD-10-CM

## 2018-06-09 MED ORDER — OSELTAMIVIR PHOSPHATE 75 MG PO CAPS: 75.0000 mg | ORAL_CAPSULE | Freq: Two times a day (BID) | ORAL | 0 refills | Status: DC

## 2018-06-09 MED ORDER — LOPERAMIDE HCL 2 MG PO CAPS: 2.0000 mg | ORAL_CAPSULE | Freq: Four times a day (QID) | ORAL | 0 refills | Status: DC | PRN

## 2018-06-09 NOTE — ED Provider Notes (Signed)
[facility]    CSN: [id number] Arrival date & time: 06/09/18  1716     History   Chief Complaint Chief Complaint  Patient presents with  . URI  . Diarrhea    HPI Randy Barnett is a 23 y.o. male.   23 yo man with months of intermittent diarrhea.  He's also had a cough for months.  Patient developed 6-7 loose stools with fever and chills last night.  No abdominal pain.  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     History reviewed. No pertinent past medical history.  There are no active problems to display for this patient.   History reviewed. No pertinent surgical history.     Home Medications    Prior to Admission medications   Medication Sig Start Date End Date Taking? Authorizing Provider  loperamide (IMODIUM) 2 MG capsule Take 1 capsule (2 mg total) by mouth 4 (four) times daily as needed for diarrhea or loose stools. 06/09/18   Elvina Sidle, MD  oseltamivir (TAMIFLU) 75 MG capsule Take 1 capsule (75 mg total) by mouth every 12 (twelve) hours. 06/09/18   Elvina Sidle, MD    Family History Family History  Problem Relation Age of Onset  . Diabetes Father     Social History Social History   Tobacco Use  . Smoking status: Current Every Day Smoker    Packs/day: 1.00    Types: Cigarettes  . Smokeless tobacco: Current User    Types: Snuff, Chew  Substance Use Topics  . Alcohol use: No    Comment: previously 1-1.5 cases per week  . Drug use: No     Allergies   Patient has no known allergies.   Review of Systems Review of Systems  Constitutional: Positive for chills and fever.  HENT: Negative for congestion.   Respiratory: Positive for cough.   Gastrointestinal: Positive for diarrhea. Negative for blood in stool and rectal pain.  Genitourinary: Negative.   All other systems reviewed and are negative.    Physical Exam Triage Vital Signs ED Triage Vitals [06/09/18 1732]  Enc Vitals Group     BP (!) 146/91     Pulse Rate 99      Resp 16     Temp (!) 100.7 F (38.2 C)     Temp Source Oral     SpO2 97 %     Weight      Height      Head Circumference      Peak Flow      Pain Score 0     Pain Loc      Pain Edu?      Excl. in GC?    No data found.  Updated Vital Signs BP (!) 146/91 (BP Location: Right Arm)   Pulse 99   Temp (!) 100.7 F (38.2 C) (Oral)   Resp 16   SpO2 97%    Physical Exam Vitals signs and nursing note reviewed.  Constitutional:      Appearance: Normal appearance. He is normal weight.  HENT:     Head: Normocephalic.  Neck:     Musculoskeletal: Normal range of motion and neck supple.  Cardiovascular:     Rate and Rhythm: Normal rate and regular rhythm.     Heart sounds: Normal heart sounds.  Pulmonary:     Effort: Pulmonary effort is normal.     Breath sounds: Normal breath sounds.  Abdominal:     General: Bowel sounds are normal.  Palpations: Abdomen is soft. There is no mass.     Tenderness: There is no abdominal tenderness.  Musculoskeletal: Normal range of motion.  Skin:    General: Skin is warm and dry.  Neurological:     General: No focal deficit present.     Mental Status: He is alert.  Psychiatric:        Mood and Affect: Mood normal.      UC Treatments / Results  Labs (all labs ordered are listed, but only abnormal results are displayed) Labs Reviewed - No data to display  EKG None  Radiology No results found.  Procedures Procedures (including critical care time)  Medications Ordered in UC Medications - No data to display  Initial Impression / Assessment and Plan / UC Course  I have reviewed the triage vital signs and the nursing notes.  Pertinent labs & imaging results that were available during my care of the patient were reviewed by me and considered in my medical decision making (see chart for details).    Final Clinical Impressions(s) / UC Diagnoses   Final diagnoses:  Influenza-like illness   Discharge Instructions   None     ED Prescriptions    Medication Sig Dispense Auth. Provider   oseltamivir (TAMIFLU) 75 MG capsule Take 1 capsule (75 mg total) by mouth every 12 (twelve) hours. 10 capsule Elvina Sidle, MD   loperamide (IMODIUM) 2 MG capsule Take 1 capsule (2 mg total) by mouth 4 (four) times daily as needed for diarrhea or loose stools. 12 capsule Elvina Sidle, MD     Controlled Substance Prescriptions [place] Controlled Substance Registry consulted? Not Applicable   Elvina Sidle, MD 06/09/18 Flossie Buffy

## 2018-06-09 NOTE — ED Triage Notes (Signed)
Pt presents to [facility] for assessment of months of diarrhea (switching between loose and liquid), nasal drainage.

## 2018-11-20 ENCOUNTER — Other Ambulatory Visit: Payer: Self-pay

## 2018-11-20 ENCOUNTER — Ambulatory Visit (HOSPITAL_COMMUNITY)
Admission: EM | Admit: 2018-11-20 | Discharge: 2018-11-20 | Disposition: A | Discharge status: Home / Self Care | Payer: Self-pay | Attending: Family Medicine | Admitting: Family Medicine

## 2018-11-20 ENCOUNTER — Encounter (HOSPITAL_COMMUNITY): Payer: Self-pay | Admitting: Emergency Medicine

## 2018-11-20 DIAGNOSIS — L309 Dermatitis, unspecified: Secondary | ICD-10-CM | POA: Insufficient documentation

## 2018-11-20 DIAGNOSIS — J039 Acute tonsillitis, unspecified: Secondary | ICD-10-CM | POA: Insufficient documentation

## 2018-11-20 LAB — POCT RAPID STREP A: Streptococcus, Group A Screen (Direct): NEGATIVE

## 2018-11-20 MED ORDER — AMOXICILLIN 500 MG PO CAPS: 500.0000 mg | ORAL_CAPSULE | Freq: Two times a day (BID) | ORAL | 0 refills | Status: AC

## 2018-11-20 NOTE — ED Triage Notes (Signed)
Throat tightness for several months.  Yesterday noticed places on tonsil.    History of boils.  Patient reports abscess to right lower back.  Scabbed bump to left lower back

## 2018-11-20 NOTE — Discharge Instructions (Addendum)
Take antibiotic as prescribed. Return for worsening pain, difficulty swallowing, breathing, fever, chest pain.

## 2018-11-20 NOTE — ED Notes (Signed)
Declined a covid test, but did request std screening if there was time.  States new partner as of 2 weeks ago

## 2018-11-20 NOTE — ED Provider Notes (Signed)
[facility]    CSN: [id number] Arrival date & time: 11/20/18  1054      History   Chief Complaint Chief Complaint  Patient presents with  . Sore Throat  . Recurrent Skin Infections    HPI Randy Barnett is a 23 y.o. male presenting for multiple complaints today. Patient has noticed red bumps pop up on his lower back along his belt line.  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  Patient is been doing his best to keep the area dry, there has also been mashing and using hot compresses without significant relief.  Patient denies history of MRSA infection, fever, purulent discharge. Patient also noting bilateral tonsillar edema with exudate.  States that he has noticed the swelling/tightness for the last 4 to 5 days, though noticed "yellow, pus spots since last night ".  Patient denies odynophagia, fever, cough.  Patient is not taking thing for symptoms.  Denies known sick contacts. Patient also requesting STD screening: States he is monogamous with 1 male partner, not routinely using condoms.  Denies penile pain, discharge, history of STD.  Patient states his partner was recently tested, "and she is all good, just on make sure".   History reviewed. No pertinent past medical history.  There are no active problems to display for this patient.   History reviewed. No pertinent surgical history.     Home Medications    Prior to Admission medications   Medication Sig Start Date End Date Taking? Authorizing Provider  amoxicillin (AMOXIL) 500 MG capsule Take 1 capsule (500 mg total) by mouth 2 (two) times daily for 10 days. 11/20/18 11/30/18  Hall-Potvin, [place], PA-C    Family History Family History  Problem Relation Age of Onset  . Diabetes Father     Social History Social History   Tobacco Use  . Smoking status: Current Every Day Smoker    Packs/day: 1.00    Types: Cigarettes  . Smokeless tobacco: Current User    Types: Snuff, Chew   Substance Use Topics  . Alcohol use: No    Comment: previously 1-1.5 cases per week  . Drug use: No     Allergies   Patient has no known allergies.   Review of Systems Review of Systems  Constitutional: Negative for fatigue and fever.  HENT: Positive for sore throat. Negative for dental problem, mouth sores, sinus pain, trouble swallowing and voice change.   Eyes: Negative for pain and redness.  Respiratory: Negative for cough and shortness of breath.   Cardiovascular: Negative for chest pain and palpitations.  Gastrointestinal: Negative for abdominal pain, diarrhea and vomiting.  Musculoskeletal: Negative for arthralgias and myalgias.  Skin: Positive for rash. Negative for wound.  Neurological: Negative for speech difficulty and headaches.  All other systems reviewed and are negative.    Physical Exam Triage Vital Signs ED Triage Vitals [11/20/18 1214]  Enc Vitals Group     BP 122/84     Pulse Rate 71     Resp 20     Temp 98.7 F (37.1 C)     Temp Source Temporal     SpO2 100 %     Weight      Height      Head Circumference      Peak Flow      Pain Score      Pain Loc      Pain Edu?      Excl. in GC?  No data found.  Updated Vital Signs BP 122/84 (BP Location: Right Arm) Comment (BP Location): large cuff  Pulse 71   Temp 98.7 F (37.1 C) (Temporal)   Resp 20   SpO2 100%   Visual Acuity Right Eye Distance:   Left Eye Distance:   Bilateral Distance:    Right Eye Near:   Left Eye Near:    Bilateral Near:     Physical Exam Constitutional:      General: He is not in acute distress. HENT:     Head: Normocephalic and atraumatic.     Right Ear: Tympanic membrane and ear canal normal.     Left Ear: Tympanic membrane and ear canal normal.     Nose: No congestion.     Mouth/Throat:     Mouth: Mucous membranes are moist.     Pharynx: Posterior oropharyngeal erythema present. No oropharyngeal exudate or uvula swelling.     Tonsils: Tonsillar exudate  present. No tonsillar abscesses. 3+ on the right. 3+ on the left.  Eyes:     General: No scleral icterus.    Pupils: Pupils are equal, round, and reactive to light.  Neck:     Musculoskeletal: Normal range of motion and neck supple.     Comments: Left-sided anterior shotty cervical lymphadenopathy that is nontender. Cardiovascular:     Rate and Rhythm: Normal rate and regular rhythm.  Pulmonary:     Effort: Pulmonary effort is normal. No respiratory distress.     Breath sounds: No wheezing.  Lymphadenopathy:     Cervical: Cervical adenopathy present.  Skin:    Coloration: Skin is not jaundiced or pale.     Comments: 2, small circumferential areas of erythema without opening, discharge, warmth, tenderness.  Neurological:     Mental Status: He is alert and oriented to person, place, and time.      UC Treatments / Results  Labs (all labs ordered are listed, but only abnormal results are displayed) Labs Reviewed  CULTURE, GROUP A STREP [facility])  POCT RAPID STREP A    EKG   Radiology No results found.  Procedures Procedures (including critical care time)  Medications Ordered in UC Medications - No data to display  Initial Impression / Assessment and Plan / UC Course  I have reviewed the triage vital signs and the nursing notes.  Pertinent labs & imaging results that were available during my care of the patient were reviewed by me and considered in my medical decision making (see chart for details).     1.  Tonsillitis Rapid strep done in office, reviewed by me: Negative- culture pending.  Will treat with amoxicillin as listed below.  Return precautions discussed, patient verbalized understanding and is agreeable to plan.  2.  STD testing Urine cytology not available due to his low supply: Patient declined urethral swab.  3.  Dermatitis Small area of benign dermatitis, likely heat related.  Patient to work on keeping area clean and dry, avoid mashing it.  Return  precautions discussed, patient verbalized understanding and is agreeable to plan.  Final Clinical Impressions(s) / UC Diagnoses   Final diagnoses:  Tonsillitis   Discharge Instructions   None    ED Prescriptions    Medication Sig Dispense Auth. Provider   amoxicillin (AMOXIL) 500 MG capsule Take 1 capsule (500 mg total) by mouth 2 (two) times daily for 10 days. 20 capsule Hall-Potvin, [place], PA-C     Controlled Substance Prescriptions [place] Controlled Substance Registry consulted? Not Applicable  Hall-Potvin, [place], [place]-C 11/20/18 1332

## 2018-11-22 LAB — CULTURE, GROUP A STREP (THRC)

## 2018-11-24 ENCOUNTER — Ambulatory Visit: Payer: Self-pay

## 2018-11-24 ENCOUNTER — Other Ambulatory Visit: Payer: Self-pay

## 2018-11-24 ENCOUNTER — Other Ambulatory Visit: Payer: Self-pay | Admitting: Occupational Medicine

## 2018-11-24 DIAGNOSIS — M545 Low back pain, unspecified: Secondary | ICD-10-CM

## 2019-04-10 ENCOUNTER — Other Ambulatory Visit: Payer: Self-pay

## 2019-04-10 ENCOUNTER — Encounter: Payer: Self-pay | Admitting: Emergency Medicine

## 2019-04-10 ENCOUNTER — Ambulatory Visit
Admission: EM | Admit: 2019-04-10 | Discharge: 2019-04-10 | Disposition: A | Discharge status: Home / Self Care | Payer: Self-pay | Attending: Emergency Medicine | Admitting: Emergency Medicine

## 2019-04-10 DIAGNOSIS — R05 Cough: Secondary | ICD-10-CM

## 2019-04-10 DIAGNOSIS — R059 Cough, unspecified: Secondary | ICD-10-CM

## 2019-04-10 DIAGNOSIS — Z20822 Contact with and (suspected) exposure to covid-19: Secondary | ICD-10-CM

## 2019-04-10 MED ORDER — LIDOCAINE VISCOUS HCL 2 % MT SOLN: 15.0000 mL | OROMUCOSAL | 0 refills | Status: DC | PRN

## 2019-04-10 MED ORDER — BENZONATATE 100 MG PO CAPS: 100.0000 mg | ORAL_CAPSULE | Freq: Three times a day (TID) | ORAL | 0 refills | Status: DC

## 2019-04-10 NOTE — Discharge Instructions (Signed)

## 2019-04-10 NOTE — ED Provider Notes (Signed)
Randy Barnett    CSN: [id number] Arrival date & time:         History   Chief Complaint Chief Complaint  Patient presents with  . URI    HPI Randy Barnett is a 24 y.o. male presenting for 1.5-week course of dry cough.  States yesterday started become productive: No blood, shortness of breath.  Patient states that he has subjective fever, unable to check his temperature at home.  Patient also noting scratchy throat, nasal congestion, nausea with intermittent diarrhea over the last few days.  Has not take anything for symptoms.  States he underwent rapid antibody test on Thursday at work which was negative.   History reviewed. No pertinent past medical history.  There are no problems to display for this patient.   History reviewed. No pertinent surgical history.     Home Medications    Prior to Admission medications   Medication Sig Start Date End Date Taking? Authorizing Provider  benzonatate (TESSALON) 100 MG capsule Take 1 capsule (100 mg total) by mouth every 8 (eight) hours. 04/10/19   Hall-Potvin, [place], PA-C  lidocaine (XYLOCAINE) 2 % solution Use as directed 15 mLs in the mouth or throat as needed for mouth pain. 04/10/19   Hall-Potvin, [place], PA-C    Family History Family History  Problem Relation Age of Onset  . Diabetes Father     Social History Social History   Tobacco Use  . Smoking status: Former Smoker    Packs/day: 1.00    Types: Cigarettes    Quit date: 03/10/2019    Years since quitting: 0.0  . Smokeless tobacco: Current User    Types: Snuff, Chew  Substance Use Topics  . Alcohol use: No    Comment: previously 1-1.5 cases per week  . Drug use: No     Allergies   Patient has no known allergies.   Review of Systems Review of Systems  Constitutional: Positive for fever. Negative for activity change, appetite change and fatigue.       Subjective  HENT: Positive for sore throat. Negative for congestion, dental  problem, ear pain, facial swelling, hearing loss, sinus pain, trouble swallowing and voice change.   Eyes: Negative for photophobia, pain and visual disturbance.  Respiratory: Positive for cough. Negative for shortness of breath, wheezing and stridor.   Cardiovascular: Negative for chest pain and palpitations.  Gastrointestinal: Positive for diarrhea and nausea. Negative for abdominal distention, abdominal pain, blood in stool, rectal pain and vomiting.       Intermittent diarrhea   Musculoskeletal: Negative for arthralgias and myalgias.  Skin: Negative for rash and wound.  Neurological: Negative for dizziness, speech difficulty and headaches.  All other systems reviewed and are negative.    Physical Exam Triage Vital Signs ED Triage Vitals  Enc Vitals Group     BP      Pulse      Resp      Temp      Temp src      SpO2      Weight      Height      Head Circumference      Peak Flow      Pain Score      Pain Loc      Pain Edu?      Excl. in [place]?    No data found.  Updated Vital Signs BP (!) 160/81 (BP Location: Left Arm)   Pulse 72   Temp 99.5 F (  37.5 C) (Temporal)   Resp 18   SpO2 99%   Visual Acuity Right Eye Distance:   Left Eye Distance:   Bilateral Distance:    Right Eye Near:   Left Eye Near:     Bilateral Near:     Physical Exam Constitutional:      General: He is not in acute distress. HENT:     Head: Normocephalic and atraumatic.     Nose: Nose normal.     Comments: Negative sinus tenderness    Mouth/Throat:     Mouth: Mucous membranes are moist.     Pharynx: Oropharynx is clear. No oropharyngeal exudate or posterior oropharyngeal erythema.  Eyes:     General: No scleral icterus.    Conjunctiva/sclera: Conjunctivae normal.     Pupils: Pupils are equal, round, and reactive to light.  Cardiovascular:     Rate and Rhythm: Normal rate.     Heart sounds: No murmur. No gallop.   Pulmonary:     Effort: Pulmonary effort is normal. No respiratory  distress.     Breath sounds: No wheezing.  Musculoskeletal:     Cervical back: Neck supple. No tenderness.  Lymphadenopathy:     Cervical: No cervical adenopathy.  Skin:    Coloration: Skin is not jaundiced or pale.  Neurological:     Mental Status: He is alert and oriented to person, place, and time.      UC Treatments / Results  Labs (all labs ordered are listed, but only abnormal results are displayed) Labs Reviewed  NOVEL CORONAVIRUS, NAA    EKG   Radiology No results found.  Procedures Procedures (including critical Barnett time)  Medications Ordered in UC Medications - No data to display  Initial Impression / Assessment and Plan / UC Course  I have reviewed the triage vital signs and the nursing notes.  Pertinent labs & imaging results that were available during my Barnett of the patient were reviewed by me and considered in my medical decision making (see chart for details).     Patient afebrile, nontoxic, with SpO2 99%.  Appears serum antibody test is done: Reviewed that this is a good marker for previous infection, though not for acute.  Covid PCR pending.  Patient to quarantine until results are back.  We will continue supportive management as outlined below.  Return precautions discussed, patient verbalized understanding and is agreeable to plan. Final Clinical Impressions(s) / UC Diagnoses   Final diagnoses:  Cough     Discharge Instructions     Tessalon for cough. Start flonase, atrovent nasal spray for nasal congestion/drainage. You can use over the counter nasal saline rinse such as neti pot for nasal congestion. Keep hydrated, your urine should be clear to pale yellow in color. Tylenol/motrin for fever and pain. Monitor for any worsening of symptoms, chest pain, shortness of breath, wheezing, swelling of the throat, go to the emergency department for further evaluation needed.     ED Prescriptions    Medication Sig Dispense Auth. Provider    benzonatate (TESSALON) 100 MG capsule Take 1 capsule (100 mg total) by mouth every 8 (eight) hours. 21 capsule Hall-Potvin, [place], PA-C   lidocaine (XYLOCAINE) 2 % solution Use as directed 15 mLs in the mouth or throat as needed for mouth pain. 100 mL Hall-Potvin, [place], PA-C     PDMP not reviewed this encounter.   Odette Fraction [place], [place] 04/10/19 1844

## 2019-04-10 NOTE — ED Triage Notes (Addendum)
Pt presents to [facility] for 1.5 week of cough, becoming more productive, warm face and shoulders without fever at home, sore throat, nasal congestion, nausea, and intermittent diarrhea.  Pt had a rapid antibody test on Thursday at work which was negative.

## 2019-04-11 LAB — NOVEL CORONAVIRUS, NAA: SARS-CoV-2, NAA: NOT DETECTED

## 2019-04-13 ENCOUNTER — Encounter (HOSPITAL_COMMUNITY): Payer: Self-pay | Admitting: Emergency Medicine

## 2019-04-13 ENCOUNTER — Emergency Department (HOSPITAL_COMMUNITY): Payer: Self-pay

## 2019-04-13 ENCOUNTER — Other Ambulatory Visit: Payer: Self-pay

## 2019-04-13 ENCOUNTER — Emergency Department (HOSPITAL_COMMUNITY)
Admission: EM | Admit: 2019-04-13 | Discharge: 2019-04-13 | Disposition: A | Discharge status: Home / Self Care | Payer: Self-pay | Attending: Emergency Medicine | Admitting: Emergency Medicine

## 2019-04-13 DIAGNOSIS — Z87891 Personal history of nicotine dependence: Secondary | ICD-10-CM | POA: Insufficient documentation

## 2019-04-13 DIAGNOSIS — G8929 Other chronic pain: Secondary | ICD-10-CM | POA: Insufficient documentation

## 2019-04-13 DIAGNOSIS — M545 Low back pain: Secondary | ICD-10-CM | POA: Insufficient documentation

## 2019-04-13 MED ORDER — LIDOCAINE 5 % EX PTCH
1.0000 | MEDICATED_PATCH | CUTANEOUS | Status: DC
  Administered 2019-04-13: 12:00:00 1 via TRANSDERMAL
  Filled 2019-04-13: qty 1

## 2019-04-13 MED ORDER — METHOCARBAMOL 500 MG PO TABS: 500.0000 mg | ORAL_TABLET | Freq: Two times a day (BID) | ORAL | 0 refills | Status: DC

## 2019-04-13 MED ORDER — KETOROLAC TROMETHAMINE 30 MG/ML IJ SOLN
30.0000 mg | Freq: Once | INTRAMUSCULAR | Status: AC
  Administered 2019-04-13: 12:00:00 30 mg via INTRAMUSCULAR
  Filled 2019-04-13: qty 1

## 2019-04-13 MED ORDER — NAPROXEN 500 MG PO TABS: 500.0000 mg | ORAL_TABLET | Freq: Two times a day (BID) | ORAL | 0 refills | Status: DC

## 2019-04-13 MED ORDER — LIDOCAINE 5 % EX PTCH: 1.0000 | MEDICATED_PATCH | CUTANEOUS | 0 refills | Status: DC

## 2019-04-13 NOTE — ED Provider Notes (Signed)
[facility] EMERGENCY DEPARTMENT Provider Note   CSN: [id number] Arrival date & time: 04/13/19  1039   History Chief Complaint  Patient presents with  . Back Pain   Randy Barnett is a 24 y.o. male with no significant past medical history who presents for evaluation of back pain. Pain persistent since Aug. Seen by PCP with negative Xray at that time. No IVDU, bowel or bladder incontinence, saddle paresthesias. Pain worse with movement and palpation.  Patient states his back pain is not changed since it started.  No pain radiating into the legs.  Denies fever, nausea, vomiting, neck pain, neck stiffness, chest pain, shortness of breath, abdominal pain, diarrhea, dysuria, bowel or bladder continence, saddle paresthesia, numbness or tingling to extremities, redness, swelling, warmth to extremities.  He is able to ambulate without difficulty.  Has been taking Tylenol without relief of his pain.  Rates his current pain a 6/10.  Denies additional aggravating or alleviating factors.  History obtained from patient and past medical record.  No interpreter is used        HPI     History reviewed. No pertinent past medical history.  There are no problems to display for this patient.   History reviewed. No pertinent surgical history.     Family History  Problem Relation Age of Onset  . Diabetes Father     Social History   Tobacco Use  . Smoking status: Former Smoker    Packs/day: 1.00    Types: Cigarettes    Quit date: 03/10/2019    Years since quitting: 0.0  . Smokeless tobacco: Current User    Types: Snuff, Chew  Substance Use Topics  . Alcohol use: No    Comment: previously 1-1.5 cases per week  . Drug use: No    Home Medications Prior to Admission medications   Medication Sig Start Date End Date Taking? Authorizing Provider  benzonatate (TESSALON) 100 MG capsule Take 1 capsule (100 mg total) by mouth every 8 (eight) hours. 04/10/19   Hall-Potvin,  [place], PA-C  lidocaine (LIDODERM) 5 % Place 1 patch onto the skin daily. Remove & Discard patch within 12 hours or as directed by MD 04/13/19   Jafari Mckillop A, PA-C  lidocaine (XYLOCAINE) 2 % solution Use as directed 15 mLs in the mouth or throat as needed for mouth pain. 04/10/19   Hall-Potvin, [place], PA-C  methocarbamol (ROBAXIN) 500 MG tablet Take 1 tablet (500 mg total) by mouth 2 (two) times daily. 04/13/19   Audreena Sachdeva A, PA-C  naproxen (NAPROSYN) 500 MG tablet Take 1 tablet (500 mg total) by mouth 2 (two) times daily. 04/13/19   Josette Shimabukuro A, PA-C    Allergies    Patient has no known allergies.  Review of Systems   Review of Systems  Constitutional: Negative.   HENT: Negative.   Eyes: Negative.   Respiratory: Negative.   Cardiovascular: Negative.   Gastrointestinal: Negative.   Genitourinary: Negative.   Musculoskeletal: Positive for back pain. Negative for arthralgias, gait problem, joint swelling, myalgias, neck pain and neck stiffness.  Skin: Negative.   Neurological: Negative.   All other systems reviewed and are negative.  Physical Exam Updated Vital Signs BP (!) 141/97   Pulse 79   Temp 98.5 F (36.9 C) (Oral)   Resp 16   SpO2 100%   Physical Exam Physical Exam  Constitutional: Pt appears well-developed and well-nourished. No distress.  HENT:  Head: Normocephalic and atraumatic.  Mouth/Throat: Oropharynx is clear  and moist. No oropharyngeal exudate.  Eyes: Conjunctivae are normal.  Neck: Normal range of motion. Neck supple.  Full ROM without pain  Cardiovascular: Normal rate, regular rhythm and intact distal pulses.   Pulmonary/Chest: Effort normal and breath sounds normal. No respiratory distress. Pt has no wheezes.  Abdominal: Soft. Pt exhibits no distension. There is no tenderness, rebound or guarding. No abd bruit or pulsatile mass Musculoskeletal:  Full range of motion of the T-spine and L-spine with flexion, hyperextension, and  lateral flexion. No midline tenderness or stepoffs. No tenderness to palpation of the spinous processes of the T-spine or L-spine. Tenderness to palpation of the paraspinous muscles of the L-spin L>R. Negative straight leg raise. Lymphadenopathy:    Pt has no cervical adenopathy.  Neurological: Pt is alert. Pt has normal reflexes.  Reflex Scores:      Bicep reflexes are 2+ on the right side and 2+ on the left side.      Brachioradialis reflexes are 2+ on the right side and 2+ on the left side.      Patellar reflexes are 2+ on the right side and 2+ on the left side.      Achilles reflexes are 2+ on the right side and 2+ on the left side. Speech is clear and goal oriented, follows commands Normal 5/5 strength in upper and lower extremities bilaterally including dorsiflexion and plantar flexion, strong and equal grip strength Sensation normal to light and sharp touch Moves extremities without ataxia, coordination intact Normal gait Normal balance No Clonus Skin: Skin is warm and dry. No rash noted or lesions noted. Pt is not diaphoretic. No erythema, ecchymosis,edema or warmth.  Psychiatric: Pt has a normal mood and affect. Behavior is normal.  Nursing note and vitals reviewed. ED Results / Procedures / Treatments   Labs (all labs ordered are listed, but only abnormal results are displayed) Labs Reviewed - No data to display  EKG None  Radiology DG Lumbar Spine Complete  Result Date: 04/13/2019 CLINICAL DATA:  Low back pain EXAM: LUMBAR SPINE - COMPLETE 4+ VIEW COMPARISON:  November 24, 2018 FINDINGS: Frontal, lateral, spot lumbosacral lateral, and bilateral oblique views were obtained. There are 5 non-rib-bearing lumbar type vertebral bodies. There is no fracture or spondylolisthesis. Disc spaces appear unremarkable. There is no appreciable facet arthropathy. IMPRESSION: No fracture or spondylolisthesis.  No appreciable arthropathy. Electronically Signed   By: Lowella Grip III M.D.    On: 04/13/2019 13:02    Procedures Procedures (including critical care time)  Medications Ordered in ED Medications  lidocaine (LIDODERM) 5 % 1 patch (1 patch Transdermal Patch Applied 04/13/19 1206)  ketorolac (TORADOL) 30 MG/ML injection 30 mg (30 mg Intramuscular Given 04/13/19 1206)    ED Course  I have reviewed the triage vital signs and the nursing notes.  Pertinent labs & imaging results that were available during my care of the patient were reviewed by me and considered in my medical decision making (see chart for details).  24 year male for evaluation of back pain.  Pain persistent since injury at work since August.  Afebrile, nonseptic, non-ill-appearing.  Ambulatory without difficulty.  No radiation of pain or paresthesias.  No red flags for back pain, specifically no IV drug use, bowel or bladder incontinence, saddle paresthesia, increased nighttime pain or personal history of malignancy.  Pain reproducible to palpation diffusely across his lumbar spine.  No midline spinal step-offs, crepitus.  No skin changes.  Heart and lungs clear.  Abdomen soft.  No  overlying skin changes.  Plain film x-ray without acute findings. Likely MSK injury. Low suspicion for cauda equina, discitis, osteomyelitis, transverse myelitis, bacterial infectious process or psoas abscess. No fever, night sweats, weight loss, h/o cancer, IVDU.  RICE protocol and pain medicine indicated and discussed with patient.  Given patient has had back pain x5 months he would benefit from orthopedic follow-up for additional possible imaging versus intervention.  Will treat symptomatically with NSAIDs, muscle relaxers and lidocaine patches.  Do not feel patient warrants emergent MRI at this time.  The patient has been appropriately medically screened and/or stabilized in the ED. I have low suspicion for any other emergent medical condition which would require further screening, evaluation or treatment in the ED or require  inpatient management.  Patient is hemodynamically stable and in no acute distress.  Patient able to ambulate in department prior to ED.  Evaluation does not show acute pathology that would require ongoing or additional emergent interventions while in the emergency department or further inpatient treatment.  I have discussed the diagnosis with the patient and answered all questions.  Pain is been managed while in the emergency department and patient has no further complaints prior to discharge.  Patient is comfortable with plan discussed in room and is stable for discharge at this time.  I have discussed strict return precautions for returning to the emergency department.  Patient was encouraged to follow-up with PCP/specialist refer to at discharge.   MDM Rules/Calculators/A&P                       Final Clinical Impression(s) / ED Diagnoses Final diagnoses:  Chronic bilateral low back pain without sciatica    Rx / DC Orders ED Discharge Orders         Ordered    methocarbamol (ROBAXIN) 500 MG tablet  2 times daily     04/13/19 1156    lidocaine (LIDODERM) 5 %  Every 24 hours     04/13/19 1156    naproxen (NAPROSYN) 500 MG tablet  2 times daily     04/13/19 1156    AMB referral to orthopedics     04/13/19 1157           Harue Pribble A, PA-C 04/13/19 1315    Sabas Sous, MD 04/17/19 1510

## 2019-04-13 NOTE — ED Notes (Signed)
Patient transported to X-ray 

## 2019-04-13 NOTE — Discharge Instructions (Addendum)
Follow-up with orthopedics.  Return for any new worsening symptoms such as bowel or bladder incontinence, numbness to the perineal region inability to walk.

## 2019-04-13 NOTE — ED Triage Notes (Signed)
C/o L lower back pain since August.  States pain started after lifting a filling cabinet.

## 2019-04-13 NOTE — ED Notes (Signed)
Patient verbalizes understanding of discharge instructions. Opportunity for questioning and answers were provided. Armband removed by staff, pt discharged from ED.   Follow-ups discussed, pt verbalizes understanding

## 2019-04-14 ENCOUNTER — Ambulatory Visit (INDEPENDENT_AMBULATORY_CARE_PROVIDER_SITE_OTHER): Payer: Self-pay | Admitting: Family Medicine

## 2019-04-14 ENCOUNTER — Encounter: Payer: Self-pay | Admitting: Family Medicine

## 2019-04-14 DIAGNOSIS — M545 Low back pain, unspecified: Secondary | ICD-10-CM

## 2019-04-14 DIAGNOSIS — G8929 Other chronic pain: Secondary | ICD-10-CM

## 2019-04-14 NOTE — Progress Notes (Signed)
Office Visit Note   Patient: Randy Barnett           Date of Birth: [birth year]           MRN: [id number] Visit Date: 04/14/2019 Requested by: Ralph Leyden A, PA-C [address] [place],  [place] [address] PCP: Patient, No Pcp Per  Subjective: Chief Complaint  Patient presents with  . Lower Back - Pain    Tightness in the lower back with sharp pains across lower back and occasionally up the spine. Back was injured late August 2020 at previous job. Had Toradol-helped 6-7 days. Then returned & worsened. Ibuprofen & muscle relaxer is starting to help.    HPI: He is here with low back pain.  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  He felt immediate pain in the left lower back, he took some ibuprofen and went to the [employer]. physician.  He was supposed to return 2 days later but did not keep the appointment and eventually was told that his Workmen's Comp. claim was denied.  He has continued to have pain and now he is working a different job which involves some physical labor, and he has been out of work due to pain the last few days and was told he needed to see an orthopedist to be cleared to return to work.  He went to the ER recently and had x-rays which I reviewed and are within normal limits.  He states that his pain radiates across to the right lower back and up towards the thoracic spine but not down the legs.  He does note that when he sits on the commode his feet start to get numb or when he sits on the ground with his legs crossed, the same thing happens.               ROS: No fevers or chills.  All other systems were reviewed and are negative.  Objective: Vital Signs: There were no vitals taken for this visit.  Physical Exam:  General:  Alert and oriented, in no acute distress. Pulm:  Breathing unlabored. Psy:  Normal mood, congruent affect. Skin: No  rash. Low back: He has a tender trigger point to the left of midline near the L5-S1 area.  No pain in the sciatic notch or the SI joints.  He has lumbar paraspinous muscle tenderness.  Negative straight leg raise, lower extremity strength and reflexes are normal.  Imaging: None today  Assessment & Plan: 1.  Chronic left-sided low back pain, suspect myofascial pain.  Cannot completely rule out disc protrusion although neurologic exam is nonfocal. -Recommended trying physical therapy.  He is using Robaxin and anti-inflammatory with good results.  Okay to return to work next Tuesday.  If he fails to improve with conservative management, then consider MRI scan.     Procedures: No procedures performed  No notes on file     PMFS History: There are no problems to display for this patient.  History reviewed. No pertinent past medical history.  Family History  Problem Relation Age of Onset  . Diabetes Father     History reviewed. No pertinent surgical history. Social History   Occupational History  . Occupation: Curator    Comment: would like to re-join [employer] (previously in [employer])  Tobacco Use  . Smoking status: Former Smoker  Packs/day: 1.00    Types: Cigarettes    Quit date: 03/10/2019    Years since quitting: 0.0  . Smokeless tobacco: Current User    Types: Snuff, Chew  Substance and Sexual Activity  . Alcohol use: No    Comment: previously 1-1.5 cases per week  . Drug use: No  . Sexual activity: Yes    Partners: Female    Birth control/protection: Condom

## 2019-04-17 ENCOUNTER — Ambulatory Visit (INDEPENDENT_AMBULATORY_CARE_PROVIDER_SITE_OTHER): Payer: Self-pay | Admitting: Physical Therapy

## 2019-04-17 ENCOUNTER — Other Ambulatory Visit: Payer: Self-pay

## 2019-04-17 ENCOUNTER — Encounter: Payer: Self-pay | Admitting: Physical Therapy

## 2019-04-17 DIAGNOSIS — M545 Low back pain: Secondary | ICD-10-CM

## 2019-04-17 DIAGNOSIS — G8929 Other chronic pain: Secondary | ICD-10-CM

## 2019-04-17 NOTE — Therapy (Signed)
[facility] [address] [place], [place], [address] Phone: [phone]   Fax:  [phone]  Physical Therapy Evaluation  Patient Details  Name: Randy Barnett MRN: [id number] Date of Birth: 07/03/1995 Referring Provider (PT): Lavada Mesi, MD   Encounter Date: 04/17/2019  PT End of Session - 04/17/19 1251    Visit Number  1    Number of Visits  --   4-6   Date for PT Re-Evaluation  05/29/19    Authorization Type  self pay    PT Start Time  1015    PT Stop Time  1105    PT Time Calculation (min)  50 min    Activity Tolerance  Patient tolerated treatment well    Behavior During Therapy  [facility] for tasks assessed/performed       History reviewed. No pertinent past medical history.  History reviewed. No pertinent surgical history.  There were no vitals filed for this visit.   Subjective Assessment - 04/17/19 0935    Subjective  ightness in the lower back with sharp pains across lower back and occasionally up the spine. Back was injured late August 2020 at previous job. he lifted a small filing cabinet and the door slid open causing him to reach awkwardly to keep it from falling.  He felt immediate pain in the left lower back, he took some ibuprofen and went to the [employer]. physician.  He was supposed to return 2 days later but did not keep the appointment and eventually was told that his Workmen's Comp. claim was denied. He has continued to have pain and now he is working a different job which involves some physical labor, and he has been out of work due to pain the last few days. He was at the [employer] and slipped and fell. He says biggest complaint today is feeling of compression around his tailbone with some pain in his left buttock that shoots over to his right side at time. He denies radiculopathy into his legs.    Pertinent History  none    Limitations  Lifting    How long can you sit comfortably?  20 min    How long  can you stand comfortably?  not limited    How long can you walk comfortably?  not limited    Diagnostic tests  lumbar XR 04/13/19 was negative    Patient Stated Goals  get rid of the pain    Currently in Pain?  Yes    Pain Score  2     Pain Location  Back    Pain Orientation  Lower    Pain Descriptors / Indicators  Aching;Crushing    Pain Type  Acute pain    Pain Radiating Towards  denies    Pain Onset  More than a month ago    Pain Frequency  Intermittent    Aggravating Factors   bending over or sitting too long    Pain Relieving Factors  meds    Multiple Pain Sites  No         [facility] PT Assessment - 04/17/19 0001      Assessment   Medical Diagnosis  Chronic bilateral low back pain    Referring Provider (PT)  Lavada Mesi, MD    Onset Date/Surgical Date  --   August 2020   Next MD Visit  05/12/19    Prior Therapy  none      Precautions   Precautions  None  Restrictions   Weight Bearing Restrictions  No      Balance Screen   Has the patient fallen in the past 6 months  Yes    How many times?  1   s[place]   Has the patient had a decrease in activity level because of a fear of falling?   No    Is the patient reluctant to leave their home because of a fear of falling?   No      Home Public house manager residence      Prior Function   Level of Independence  Independent    Vocation  Full time employment    Clinical biochemist, so heavy lifting, pushing, pulling    Leisure  gym      Cognition   Overall Cognitive Status  Within Functional Limits for tasks assessed      Sensation   Light Touch  Appears Intact      Coordination   Gross Motor Movements are Fluid and Coordinated  Yes      Posture/Postural Control   Posture Comments  WNL      ROM / Strength   AROM / PROM / Strength  AROM;Strength      AROM   Overall AROM Comments  WFL lumbar ROM, no pain with  movements but appeared to favor extension      Strength   Overall Strength Comments  WNL LE strength, fair core strength      Flexibility   Soft Tissue Assessment /Muscle Length  --   mod tightness in hamstrings, hip rotators, lumbar P.S.     Palpation   Spinal mobility  WNL    Palpation comment  TTP L5,S1,S2      Special Tests   Other special tests  neg slump test, neg quadrant test, Neg Fabers test, neg SLR test       Transfers   Transfers  Independent with all Transfers      Ambulation/Gait   Gait Comments  WNL                Objective measurements completed on examination: See above findings.      [facility] Adult PT Treatment/Exercise - 04/17/19 0001      Exercises   Exercises  Lumbar      Lumbar Exercises: Stretches   Other Lumbar Stretch Exercise  standing repeated lumbar extensions X 10 reps, Prone press ups X 10 reps             PT Education - 04/17/19 1250    Education Details  HEP, POC, Exam findings, general weight loss tips    Person(s) Educated  Patient    Methods  Explanation;Demonstration;Verbal cues;Handout    Comprehension  Verbalized understanding;Returned demonstration;Need further instruction          PT Long Term Goals - 04/17/19 1301      PT LONG TERM GOAL #1   Title  Pt wil be I and compliant with HEP. (target for all goals 6 weeks 05/29/19)    Status  New      PT LONG TERM GOAL #2   Title  Pt will be able to return to normal work and activity with less than 1-2/10 overall pain.    Status  New             Plan - 04/17/19 1252    Clinical Impression Statement  Pt  presents with subacute low back pain that appears to be resolving.Pain presents more muscular in nature but did appear to have a extension preference so he may have slight disc bulge. He denies any lumbar radiculopathy.  He had no pain today but does lack mild core strength, and has mod lumbar/hip tightness and muscle tension. Special testing negative today  and he was proved HEP for lumbar/hip stretching, mobility, and core strength. He will likely only need 4-6 PT visits to address his deficits as he is improving.    Personal Factors and Comorbidities  Other   has manual labor job   Examination-Activity Limitations  Squat;Lift    Examination-Participation Restrictions  Other   work   Stability/Clinical Decision Making  Evolving/Moderate complexity    Clinical Decision Making  Low    PT Treatment/Interventions  Cryotherapy;Electrical Stimulation;Moist Heat;Traction;Ultrasound;Therapeutic activities;Therapeutic exercise;Neuromuscular re-education;Manual techniques;Passive range of motion;Dry needling;Taping;Spinal Manipulations;Joint Manipulations    PT Next Visit Plan  lumbar stretching, core and functional strength, consider traction or manual therapy including DN, spinal mobs/manip, traction    PT Home Exercise Plan  Access Code: [email or web address]    Consulted and Agree with Plan of Care  Patient       Patient will benefit from skilled therapeutic intervention in order to improve the following deficits and impairments:  Decreased activity tolerance, Decreased strength, Increased fascial restricitons, Increased muscle spasms, Pain  Visit Diagnosis: Chronic bilateral low back pain without sciatica     Problem List There are no problems to display for this patient.   Silvestre Mesi 04/17/2019, 1:05 PM  [facility] [address] [place], [place], [address] Phone: [phone]   Fax:  [phone]  Name: Randy Barnett MRN: [id number] Date of Birth: [birth year]

## 2019-04-17 NOTE — Patient Instructions (Signed)
Access Code: [email or web address]  URL: [email or web address]  Date: 04/17/2019  Prepared by: Ivery Quale   Exercises  Supine Piriformis Stretch - 3 reps - 1 sets - 30 hold - 2x daily - 6x weekly  Supine Hamstring Stretch with Strap - 3 reps - 1 sets - 30 hold - 2x daily - 6x weekly  Standing Lumbar Extension - 10 reps - 1-2 sets - 5 hold - 2x daily - 6x weekly  Supine Bridge - 10 reps - 1-2 sets - 5 hold - 2x daily - 6x weekly  Bird Dog - 10 reps - 1 sets - 5 sec hold - 2x daily - 6x weekly  Prone Press Up - 10 reps - 2-3 sets - 2x daily - 6x weekly  Standard Plank - 10 reps - 3 sets - 2x daily - 6x weekly  Side Plank on Elbow - 10 reps - 3 sets - 2x daily - 6x weekly  Standing Quadratus Lumborum Stretch with Doorway - 3 reps - 1 sets - 30 hold - 2x daily - 6x weekly  Slump Stretch - 10 reps - 1-2 sets - 3 hold - 2x daily - 6x weekly  Hooklying Lumbar Traction - 10 reps - 3 sets - 2x daily - 6x weekly

## 2019-04-28 ENCOUNTER — Other Ambulatory Visit: Payer: Self-pay

## 2019-04-28 ENCOUNTER — Emergency Department (HOSPITAL_COMMUNITY)
Admission: EM | Admit: 2019-04-28 | Discharge: 2019-04-28 | Disposition: A | Discharge status: Home / Self Care | Payer: Self-pay | Attending: Emergency Medicine | Admitting: Emergency Medicine

## 2019-04-28 ENCOUNTER — Encounter (HOSPITAL_COMMUNITY): Payer: Self-pay

## 2019-04-28 DIAGNOSIS — S39012D Strain of muscle, fascia and tendon of lower back, subsequent encounter: Secondary | ICD-10-CM | POA: Insufficient documentation

## 2019-04-28 DIAGNOSIS — S39012A Strain of muscle, fascia and tendon of lower back, initial encounter: Secondary | ICD-10-CM

## 2019-04-28 DIAGNOSIS — F17290 Nicotine dependence, other tobacco product, uncomplicated: Secondary | ICD-10-CM | POA: Insufficient documentation

## 2019-04-28 DIAGNOSIS — X58XXXA Exposure to other specified factors, initial encounter: Secondary | ICD-10-CM | POA: Insufficient documentation

## 2019-04-28 MED ORDER — PREDNISONE 20 MG PO TABS: ORAL_TABLET | ORAL | 0 refills | Status: DC

## 2019-04-28 NOTE — ED Triage Notes (Signed)
Patient seen here 04/13/19 for back pain and sciatica.  Still with a lot of pain and unable to see Ortho yet

## 2019-04-28 NOTE — Discharge Instructions (Signed)
Please read and follow all provided instructions.  Your diagnoses today include:  1. Strain of lumbar region, initial encounter     Tests performed today include:  Vital signs - see below for your results today  Medications prescribed:   Prednisone - steroid medicine   It is best to take this medication in the morning to prevent sleeping problems. If you are diabetic, monitor your blood sugar closely and stop taking Prednisone if blood sugar is over 300. Take with food to prevent stomach upset.   Take any prescribed medications only as directed.  Home care instructions:   Follow any educational materials contained in this packet  Please rest, use ice or heat on your back for the next several days  Do not lift, push, pull anything more than 10 pounds for the next week  Follow-up instructions: Please follow-up with your orthopedist on Monday as planned.  Return instructions:  SEEK IMMEDIATE MEDICAL ATTENTION IF YOU HAVE:  New numbness, tingling, weakness, or problem with the use of your arms or legs  Severe back pain not relieved with medications  Loss control of your bowels or bladder  Increasing pain in any areas of the body (such as chest or abdominal pain)  Shortness of breath, dizziness, or fainting.   Worsening nausea (feeling sick to your stomach), vomiting, fever, or sweats  Any other emergent concerns regarding your health   Additional Information:  Your vital signs today were: BP 126/76 (BP Location: Left Arm)   Pulse 82   Temp 98.1 F (36.7 C) (Oral)   Resp 17   Ht 6\' 1"  (1.854 m)   Wt 111.1 kg   SpO2 96%   BMI 32.32 kg/m  If your blood pressure (BP) was elevated above 135/85 this visit, please have this repeated by your doctor within one month. --------------

## 2019-04-28 NOTE — ED Provider Notes (Signed)
[facility] DEPARTMENT Provider Note   CSN: [id number] Arrival date & time: 04/28/19  1156     History Chief Complaint  Patient presents with  . Back Pain    Randy Barnett is a 24 y.o. male.  Patient presents today with continued lower back pain.  Patient was seen in the ED on 04/13/2019 and was prescribed Robaxin, Lidoderm, NSAIDs.  He followed up with Ortho the following day and symptoms were felt to most likely represent myofascial pain, plan was for physical therapy and MRI of symptoms were persistent and nonresponsive to physical therapy.  Patient had first physical therapy appointment on 04/17/2019.  Patient is here because he continues to have ongoing pain.  Pain is across the sacral region of his lower back bilaterally.  He has what is described as a "tightness" into his left hamstring.  Certain positions make the pain less in certain positions make the pain worse.  He is able to walk. Patient denies warning symptoms of back pain including: fecal incontinence, urinary retention or overflow incontinence, night sweats, waking from sleep with back pain, unexplained fevers or weight loss, h/o cancer, IVDU, recent trauma.  States that he has a follow-up appointment with his orthopedist next week.  He continues to take the prescribed treatments with varying results.           History reviewed. No pertinent past medical history.  There are no problems to display for this patient.   History reviewed. No pertinent surgical history.     Family History  Problem Relation Age of Onset  . Diabetes Father     Social History   Tobacco Use  . Smoking status: Former Smoker    Packs/day: 1.00    Types: Cigarettes    Quit date: 03/10/2019    Years since quitting: 0.1  . Smokeless tobacco: Current User    Types: Snuff, Chew  Substance Use Topics  . Alcohol use: No    Comment: previously 1-1.5 cases per week  . Drug use: No    Home Medications  Prior to Admission medications   Medication Sig Start Date End Date Taking? Authorizing Provider  benzonatate (TESSALON) 100 MG capsule Take 1 capsule (100 mg total) by mouth every 8 (eight) hours. 04/10/19   Hall-Potvin, [place], PA-C  lidocaine (LIDODERM) 5 % Place 1 patch onto the skin daily. Remove & Discard patch within 12 hours or as directed by MD 04/13/19   Henderly, Britni A, PA-C  lidocaine (XYLOCAINE) 2 % solution Use as directed 15 mLs in the mouth or throat as needed for mouth pain. 04/10/19   Hall-Potvin, [place], PA-C  methocarbamol (ROBAXIN) 500 MG tablet Take 1 tablet (500 mg total) by mouth 2 (two) times daily. 04/13/19   Henderly, Britni A, PA-C  naproxen (NAPROSYN) 500 MG tablet Take 1 tablet (500 mg total) by mouth 2 (two) times daily. 04/13/19   Henderly, Britni A, PA-C    Allergies    Patient has no known allergies.  Review of Systems   Review of Systems  Constitutional: Negative for fever and unexpected weight change.  Gastrointestinal: Negative for constipation.       Neg for fecal incontinence  Genitourinary: Negative for difficulty urinating, flank pain and hematuria.       Negative for urinary incontinence or retention  Musculoskeletal: Positive for back pain.  Neurological: Negative for weakness and numbness.       Negative for saddle paresthesias     Physical Exam Updated  Vital Signs BP 126/76 (BP Location: Left Arm)   Pulse 82   Temp 98.1 F (36.7 C) (Oral)   Resp 17   Ht 6\' 1"  (1.854 m)   Wt 111.1 kg   SpO2 96%   BMI 32.32 kg/m   Physical Exam Vitals and nursing note reviewed.  Constitutional:      Appearance: He is well-developed.  HENT:     Head: Normocephalic and atraumatic.  Eyes:     Conjunctiva/sclera: Conjunctivae normal.  Abdominal:     Palpations: Abdomen is soft.     Tenderness: There is no abdominal tenderness.  Musculoskeletal:        General: Tenderness present. No swelling. Normal range of motion.     Cervical back: Normal  range of motion.     Lumbar back: Spasms and tenderness present. No swelling. Normal range of motion.       Back:     Comments: No step-off noted with palpation of spine.   Skin:    General: Skin is warm and dry.  Neurological:     Mental Status: He is alert.     Sensory: No sensory deficit.     Motor: No abnormal muscle tone.     Deep Tendon Reflexes: Reflexes are normal and symmetric.     Comments: 5/5 strength in entire lower extremities bilaterally. No sensation deficit.      ED Results / Procedures / Treatments   Labs (all labs ordered are listed, but only abnormal results are displayed) Labs Reviewed - No data to display  EKG None  Radiology No results found.  Procedures Procedures (including critical care time)  Medications Ordered in ED Medications - No data to display  ED Course  I have reviewed the triage vital signs and the nursing notes.  Pertinent labs & imaging results that were available during my care of the patient were reviewed by me and considered in my medical decision making (see chart for details).  Patient seen and examined.  Reviewed previous notes to this point.  Patient continues to have back pain.  So far, not much improvement with current treatment and therapies.  Questionable radicular pain.  Will give empiric course of steroids.  Otherwise, patient encouraged to continue ongoing therapies and follow-up with his orthopedist next week.  We discussed red flags and no emergent need for advanced imaging today.  Vital signs reviewed and are as follows: BP 126/76 (BP Location: Left Arm)   Pulse 82   Temp 98.1 F (36.7 C) (Oral)   Resp 17   Ht 6\' 1"  (1.854 m)   Wt 111.1 kg   SpO2 96%   BMI 32.32 kg/m       MDM Rules/Calculators/A&P                      Patient with back pain, questionable radicular features into the left leg, although he describes this more as a tightness rather than a shooting pain. No neurological deficits. Patient is  ambulatory. No warning symptoms of back pain including: fecal incontinence, urinary retention or overflow incontinence, night sweats, waking from sleep with back pain, unexplained fevers or weight loss, h/o cancer, IVDU, recent trauma. No concern for cauda equina, epidural abscess, or other serious cause of back pain. Conservative measures such as rest, ice/heat and pain medicine indicated with PCP follow-up if no improvement with conservative management.   Final Clinical Impression(s) / ED Diagnoses Final diagnoses:  Strain of lumbar region, initial encounter  Rx / DC Orders ED Discharge Orders         Ordered    predniSONE (DELTASONE) 20 MG tablet     04/28/19 1256           Renne Crigler, PA-C 04/28/19 1303    Eber Hong, MD 04/28/19 [phone]

## 2019-05-01 ENCOUNTER — Encounter: Payer: Self-pay | Admitting: Family Medicine

## 2019-05-01 ENCOUNTER — Ambulatory Visit (INDEPENDENT_AMBULATORY_CARE_PROVIDER_SITE_OTHER): Payer: Self-pay | Admitting: Family Medicine

## 2019-05-01 ENCOUNTER — Other Ambulatory Visit: Payer: Self-pay

## 2019-05-01 DIAGNOSIS — G8929 Other chronic pain: Secondary | ICD-10-CM

## 2019-05-01 DIAGNOSIS — M545 Low back pain: Secondary | ICD-10-CM

## 2019-05-01 MED ORDER — TIZANIDINE HCL 2 MG PO TABS: 2.0000 mg | ORAL_TABLET | Freq: Four times a day (QID) | ORAL | 1 refills | Status: DC | PRN

## 2019-05-01 NOTE — Progress Notes (Signed)
I saw and examined the patient with Dr. Robby Sermon and agree with assessment and plan as outlined.    Ongoing low back pain now with some radiation into the left posterior hip and hamstring area.  Went to the ER recently with increased pain.  Scheduled to go back to work tomorrow.  He does not want to pursue MRI scan.  He does not have insurance right now.  He is feeling somewhat better with oral prednisone so he will continue that.  I wrote him a note for light duty restrictions, but he understands that his employer is not obligated to fulfill them since his original injury was on the job at a different location.  Could contemplate epidural injection if symptoms worsen.

## 2019-05-01 NOTE — Progress Notes (Signed)
Randy Barnett - 24 y.o. male MRN [id number]  Date of birth: 1995-10-21  Office Visit Note: Visit Date: 05/01/2019 PCP: Patient, No Pcp Per Referred by: No ref. provider found  Subjective: Chief Complaint  Patient presents with  . Lower Back - Pain, Follow-up    Now on prednisone from the ED. It helps, but "I feel like it's a bandaid." Tightness in the left hamstring and up the back. Was supposed to return to work today, but cannot due to pain.   HPI: Randy Barnett is a 24 y.o. male who comes in today for follow up of lower back pain. He has had pain in left sided back since August. Was seen on 1/15. At that point, he was not having radicular symptoms and was diagnosed with low back strain, referred to PT. He has been taking robaxin and ibuprofen which help with the pain some. He presented to the ED on 1/29 because the pain worsened and "almost sent him to the floor". He is now having shooting pain behind left buttocks and down back of leg, as well as pain into his left testicle. ED prescribed prednisone which has helped with pain. Pain is worse when leaning forward.   ROS Otherwise per HPI.  Assessment & Plan: Visit Diagnoses:  1. Chronic bilateral low back pain without sciatica     Plan: Suspect disc protrusion with radicular symptoms that he currently has. Recommended continuing PT exercises to help disc move back in place. Will trial alternative muscle relaxer tizanidine to see if this has any better effect. Discussed various options including epidural spinal injection but he currently does not have insurance and it is cost prohibitive. Discussed MRI but this would likely not change management at this time.   Meds & Orders:  Meds ordered this encounter  Medications  . tiZANidine (ZANAFLEX) 2 MG tablet    Sig: Take 1-2 tablets (2-4 mg total) by mouth every 6 (six) hours as needed for muscle spasms.    Dispense:  60 tablet    Refill:  1   No orders of the defined types were  placed in this encounter.   Follow-up: PRN  Procedures: No procedures performed  No notes on file   Clinical History: No specialty comments available.   He reports that he quit smoking about 7 weeks ago. His smoking use included cigarettes. He smoked 1.00 pack per day. His smokeless tobacco use includes snuff and chew. No results for input(s): HGBA1C, LABURIC in the last 8760 hours.  Objective:  VS:  HT:    WT:   BMI:     BP:   HR: bpm  TEMP: ( )  RESP:  Physical Exam  PHYSICAL EXAM: Gen: NAD, alert, cooperative with exam, well-appearing HEENT: clear conjunctiva,  CV:  no edema, capillary refill brisk, normal rate Resp: non-labored Skin: no rashes, normal turgor  Neuro: no gross deficits.  Psych:  alert and oriented  Ortho Exam  Lumbar spine: - Inspection: no gross deformity or asymmetry, swelling or ecchymosis - Palpation: TTP over L4-L5 with trigger point in left paraspinal muscle - ROM: full active ROM of the lumbar spine in flexion and extension without pain - Strength: 5/5 strength of lower extremity in L4-S1 nerve root distributions b/l; normal gait - Neuro: sensation intact in the L4-S1 nerve root distribution b/l, 2+ L4 and S1 reflexes  Imaging: No results found.  Past Medical/Family/Surgical/Social History: Medications & Allergies reviewed per EMR, new medications updated. There are no problems to display  for this patient.  History reviewed. No pertinent past medical history. Family History  Problem Relation Age of Onset  . Diabetes Father    History reviewed. No pertinent surgical history. Social History   Occupational History  . Occupation: Curator    Comment: would like to re-join [employer] (previously in [employer])  Tobacco Use  . Smoking status: Former Smoker    Packs/day: 1.00    Types: Cigarettes    Quit date: 03/10/2019    Years since quitting: 0.1  . Smokeless tobacco: Current User    Types: Snuff, Chew  Substance and Sexual  Activity  . Alcohol use: No    Comment: previously 1-1.5 cases per week  . Drug use: No  . Sexual activity: Yes    Partners: Female    Birth control/protection: Condom

## 2019-05-08 ENCOUNTER — Emergency Department (HOSPITAL_COMMUNITY)
Admission: EM | Admit: 2019-05-08 | Discharge: 2019-05-08 | Disposition: A | Discharge status: Home / Self Care | Payer: Self-pay | Attending: Emergency Medicine | Admitting: Emergency Medicine

## 2019-05-08 ENCOUNTER — Encounter: Payer: Self-pay | Admitting: Emergency Medicine

## 2019-05-08 ENCOUNTER — Other Ambulatory Visit: Payer: Self-pay

## 2019-05-08 ENCOUNTER — Encounter (HOSPITAL_COMMUNITY): Payer: Self-pay | Admitting: Emergency Medicine

## 2019-05-08 ENCOUNTER — Emergency Department (HOSPITAL_COMMUNITY): Payer: Self-pay

## 2019-05-08 ENCOUNTER — Ambulatory Visit
Admission: EM | Admit: 2019-05-08 | Discharge: 2019-05-08 | Disposition: A | Discharge status: Home / Self Care | Payer: Self-pay

## 2019-05-08 DIAGNOSIS — R9431 Abnormal electrocardiogram [ECG] [EKG]: Secondary | ICD-10-CM

## 2019-05-08 DIAGNOSIS — Z8616 Personal history of COVID-19: Secondary | ICD-10-CM | POA: Insufficient documentation

## 2019-05-08 DIAGNOSIS — F17210 Nicotine dependence, cigarettes, uncomplicated: Secondary | ICD-10-CM | POA: Insufficient documentation

## 2019-05-08 DIAGNOSIS — Z79899 Other long term (current) drug therapy: Secondary | ICD-10-CM | POA: Insufficient documentation

## 2019-05-08 DIAGNOSIS — F419 Anxiety disorder, unspecified: Secondary | ICD-10-CM

## 2019-05-08 DIAGNOSIS — R030 Elevated blood-pressure reading, without diagnosis of hypertension: Secondary | ICD-10-CM | POA: Insufficient documentation

## 2019-05-08 DIAGNOSIS — R202 Paresthesia of skin: Secondary | ICD-10-CM | POA: Insufficient documentation

## 2019-05-08 LAB — CBC
HCT: 50.7 % (ref 39.0–52.0)
Hemoglobin: 17.2 g/dL — ABNORMAL HIGH (ref 13.0–17.0)
MCH: 30 pg (ref 26.0–34.0)
MCHC: 33.9 g/dL (ref 30.0–36.0)
MCV: 88.5 fL (ref 80.0–100.0)
Platelets: 230 10*3/uL (ref 150–400)
RBC: 5.73 MIL/uL (ref 4.22–5.81)
RDW: 12 % (ref 11.5–15.5)
WBC: 8.8 10*3/uL (ref 4.0–10.5)
nRBC: 0 % (ref 0.0–0.2)

## 2019-05-08 LAB — BASIC METABOLIC PANEL
Anion gap: 12 (ref 5–15)
BUN: 13 mg/dL (ref 6–20)
CO2: 23 mmol/L (ref 22–32)
Calcium: 9.3 mg/dL (ref 8.9–10.3)
Chloride: 102 mmol/L (ref 98–111)
Creatinine, Ser: 1.17 mg/dL (ref 0.61–1.24)
GFR calc Af Amer: >60 mL/min (ref >60)
GFR calc non Af Amer: >60 mL/min (ref >60)
Glucose, Bld: 114 mg/dL — ABNORMAL HIGH (ref 70–99)
Potassium: 4 mmol/L (ref 3.5–5.1)
Sodium: 137 mmol/L (ref 135–145)

## 2019-05-08 LAB — TROPONIN I (HIGH SENSITIVITY)
Troponin I (High Sensitivity): <2 ng/L (ref <18)
Troponin I (High Sensitivity): <2 ng/L (ref <18)

## 2019-05-08 MED ORDER — SODIUM CHLORIDE 0.9% FLUSH: 3.0000 mL | Freq: Once | INTRAVENOUS | Status: DC

## 2019-05-08 NOTE — Discharge Instructions (Signed)
Recommend you go to ER for further evaluation 

## 2019-05-08 NOTE — ED Triage Notes (Signed)
Pt in POV. Sent from [facility] where pt presented with multiple complaints. Reports intermittent CP, SOB, facial/sinus pressure, tinglings in face/neck, and cold extremities. Pt in NAD, VSS.

## 2019-05-08 NOTE — ED Notes (Signed)
Patient able to ambulate independently  

## 2019-05-08 NOTE — ED Provider Notes (Signed)
[facility]    CSN: [id number] Arrival date & time: 05/08/19  1218      History   Chief Complaint Chief Complaint  Patient presents with  . Tachycardia    HPI Randy Barnett is a 24 y.o. male with history of tobacco and caffeine use presenting for 2-day course of tingling/pressure sensation to his face, bilateral arms.  States it has come and gone: No known exacerbating, alleviating factors.  Patient states that PTA he felt like his body went cold and that he had an intense pressure sensation in his body.  States he checked his heart rate and it was anywhere from 130-160 with his blood pressure reportedly been systolic greater than 220, diastolic greater than 130.  Patient states he took naproxen, steroid, muscle relaxer with some relief.  Patient currently denying symptoms in office.  Of note, patient did have Covid infection 3 to 4 weeks ago, though his URI symptoms have resolved.  Patient also notes a sensation of everything being in slow motion when turning his head.  Denies chest pain, dizziness, nausea when this happens.  No change in vision or hearing, worse headache of life.  Patient does note smoking 1.5 packs of cigarettes since yesterday afternoon.  Denies personal history of cardiopulmonary disease, dyspnea with exertion, lower extremity edema, prolonged stasis or recent surgery/fracture.  No known history of DVT, PE.   History reviewed. No pertinent past medical history.  There are no problems to display for this patient.   History reviewed. No pertinent surgical history.     Home Medications    Prior to Admission medications   Medication Sig Start Date End Date Taking? Authorizing Provider  benzonatate (TESSALON) 100 MG capsule Take 1 capsule (100 mg total) by mouth every 8 (eight) hours. 04/10/19   Hall-Potvin, [place], PA-C  lidocaine (LIDODERM) 5 % Place 1 patch onto the skin daily. Remove & Discard patch within 12 hours or as directed by MD 04/13/19    Henderly, Britni A, PA-C  lidocaine (XYLOCAINE) 2 % solution Use as directed 15 mLs in the mouth or throat as needed for mouth pain. 04/10/19   Hall-Potvin, [place], PA-C  methocarbamol (ROBAXIN) 500 MG tablet Take 1 tablet (500 mg total) by mouth 2 (two) times daily. 04/13/19   Henderly, Britni A, PA-C  naproxen (NAPROSYN) 500 MG tablet Take 1 tablet (500 mg total) by mouth 2 (two) times daily. 04/13/19   Henderly, Britni A, PA-C  predniSONE (DELTASONE) 20 MG tablet 3 Tabs PO Days 1-3, then 2 tabs PO Days 4-6, then 1 tab PO Day 7-9, then Half Tab PO Day 10-12 04/28/19   Renne Crigler, PA-C  tiZANidine (ZANAFLEX) 2 MG tablet Take 1-2 tablets (2-4 mg total) by mouth every 6 (six) hours as needed for muscle spasms. 05/01/19   Hilts, Casimiro Needle, MD    Family History Family History  Problem Relation Age of Onset  . Diabetes Father     Social History Social History   Tobacco Use  . Smoking status: Current Every Day Smoker    Packs/day: 1.00    Types: Cigarettes  . Smokeless tobacco: Current User    Types: Snuff, Chew  Substance Use Topics  . Alcohol use: No    Comment: previously 1-1.5 cases per week  . Drug use: No     Allergies   Patient has no known allergies.   Review of Systems As per HPI   Physical Exam Triage Vital Signs ED Triage Vitals [05/08/19 1254]  Enc Vitals Group     BP 136/86     Pulse Rate 99     Resp 18     Temp 98 F (36.7 C)     Temp Source Temporal     SpO2 96 %     Weight      Height      Head Circumference      Peak Flow      Pain Score 2     Pain Loc      Pain Edu?      Excl. in GC?    No data found.  Updated Vital Signs BP 136/86 (BP Location: Right Arm)   Pulse 99   Temp 98 F (36.7 C) (Temporal)   Resp 18   SpO2 96%   Visual Acuity Right Eye Distance:   Left Eye Distance:   Bilateral Distance:    Right Eye Near:   Left Eye Near:    Bilateral Near:     Physical Exam Constitutional:      General: He is not in acute  distress.    Appearance: He is not ill-appearing or diaphoretic.  HENT:     Head: Normocephalic and atraumatic.     Mouth/Throat:     Mouth: Mucous membranes are moist.     Pharynx: Oropharynx is clear.  Eyes:     General: No scleral icterus.    Conjunctiva/sclera: Conjunctivae normal.     Pupils: Pupils are equal, round, and reactive to light.  Neck:     Comments: Trachea midline, negative JVD Cardiovascular:     Rate and Rhythm: Normal rate and regular rhythm.     Heart sounds: No murmur. No gallop.   Pulmonary:     Effort: Pulmonary effort is normal. No respiratory distress.     Breath sounds: No wheezing.  Musculoskeletal:        General: Normal range of motion.     Right lower leg: No edema.     Left lower leg: No edema.  Skin:    General: Skin is warm.     Capillary Refill: Capillary refill takes less than 2 seconds.     Coloration: Skin is not jaundiced or pale.  Neurological:     General: No focal deficit present.     Mental Status: He is alert and oriented to person, place, and time.      UC Treatments / Results  Labs (all labs ordered are listed, but only abnormal results are displayed) Labs Reviewed - No data to display  EKG   Radiology   Procedures Procedures (including critical care time)  Medications Ordered in UC Medications - No data to display  Initial Impression / Assessment and Plan / UC Course  I have reviewed the triage vital signs and the nursing notes.  Pertinent labs & imaging results that were available during my care of the patient were reviewed by me and considered in my medical decision making (see chart for details).     Patient afebrile, nontoxic in office today.  Reports being largely asymptomatic in office, though patient does appear apprehensive about episode that occurred earlier today.  EKG done in office, reviewed by me without previous to compare: Significant for NSR, ventricular rate 79 bpm.  No QTC prolongation, ST  elevation or depression.  Patient does have epsilon waves in leads II, V3-V5 with right ward axis deviation.  Overall, abnormal EKG, though this provider has low concern for acute process.  No known history  of DVT/PE, though patient did recently have COVID-19 infection which causes hypercoagulability.  Given patient's anxiety, lack of outpatient follow-up care (has been seen numerous times at this facility with recommendations to follow-up with PCP though has not to date), discussed the patient could go to ER for further evaluation of chest pain with abnormal EKG. patient electing to go to ER and personal vehicle, to which I am agreeable as he is not actively having chest pain.  Return precautions discussed, patient verbalized understanding and is agreeable to plan. Final Clinical Impressions(s) / UC Diagnoses   Final diagnoses:  Abnormal EKG     Discharge Instructions     Recommend you go to ER for further evaluation.    ED Prescriptions    None     PDMP not reviewed this encounter.   Hall-Potvin, [place], [place] 05/09/19 [phone]

## 2019-05-08 NOTE — Discharge Instructions (Addendum)
You were seen today for elevated blood pressure reading, elevated heart rate and abnormal sensations in your arms, face and chest. Your workup was very reassuring here in our emergency department. Your heart enzymes and repeat EKG were reassuring that you are not having any acute heart strain or arrhythmia. Your chest xray was normal and the remainder of your blood work was very reassuring. Your blood pressure did remain slightly elevated but your heart rate is normal. There are a few reasons why you may be having your symptoms today. These include excessive caffeine and nicotine intake, the prednisone medication or possibly even residual affects from Covid 19. 1 in 5 individuals will have symptoms that last for 3 months after being diagnosed with covid 19. It is important that you continue to take your medications as prescribed, avoid any nicotine or caffeine, eat a healthy diet and follow up with a primary care provider who can continue to monitor your blood pressure and order additional tests that are not routinely done in the ER. Thank you for allowing me to care for you today. Please return to the emergency department if you have new or worsening symptoms. Take your medications as instructed.

## 2019-05-08 NOTE — ED Provider Notes (Signed)
[facility] EMERGENCY DEPARTMENT Provider Note   CSN: [id number] Arrival date & time: 05/08/19  1548     History No chief complaint on file.   Randy Barnett is a 24 y.o. male.  Patient is a 24 year old male with no significant past medical history presenting to the emergency department for multiple complaints.  Patient reports over the past couple days he has had sensations where he has felt like he was having "static" in his chest, arms and face.  Reports that when he felt this he used a home blood pressure wrist cuff to check his blood pressure and he had multiple elevated blood pressure readings as well as a elevated heart reading.  He went to urgent care who did an EKG which supposedly read as abnormal so he was sent to the emergency department.  Patient reports no exacerbating or relieving factors of his symptoms.  He denies any blurred vision or double vision.  He denies any nausea, vomiting or other URI symptoms.  He reports that about 2 weeks ago he was diagnosed with COVID-19 but only have mild symptoms from this.  He denies any weakness in his upper or lower extremities.  He is currently being treated for lumbar radicular pain with prednisone, naproxen and tizanidine.  He does note that he smokes a half a pack of cigarettes a day and uses chewing tobacco.  Also reports that he drinks a coffee as well as 2 caffeinated sodas prior to arrival today.        History reviewed. No pertinent past medical history.  There are no problems to display for this patient.   History reviewed. No pertinent surgical history.     Family History  Problem Relation Age of Onset  . Diabetes Father     Social History   Tobacco Use  . Smoking status: Current Every Day Smoker    Packs/day: 1.00    Types: Cigarettes  . Smokeless tobacco: Current User    Types: Snuff, Chew  Substance Use Topics  . Alcohol use: No    Comment: previously 1-1.5 cases per week  . Drug use:  No    Home Medications Prior to Admission medications   Medication Sig Start Date End Date Taking? Authorizing Provider  benzonatate (TESSALON) 100 MG capsule Take 1 capsule (100 mg total) by mouth every 8 (eight) hours. 04/10/19   Hall-Potvin, [place], PA-C  lidocaine (LIDODERM) 5 % Place 1 patch onto the skin daily. Remove & Discard patch within 12 hours or as directed by MD 04/13/19   Henderly, Britni A, PA-C  lidocaine (XYLOCAINE) 2 % solution Use as directed 15 mLs in the mouth or throat as needed for mouth pain. 04/10/19   Hall-Potvin, [place], PA-C  methocarbamol (ROBAXIN) 500 MG tablet Take 1 tablet (500 mg total) by mouth 2 (two) times daily. 04/13/19   Henderly, Britni A, PA-C  naproxen (NAPROSYN) 500 MG tablet Take 1 tablet (500 mg total) by mouth 2 (two) times daily. 04/13/19   Henderly, Britni A, PA-C  predniSONE (DELTASONE) 20 MG tablet 3 Tabs PO Days 1-3, then 2 tabs PO Days 4-6, then 1 tab PO Day 7-9, then Half Tab PO Day 10-12 04/28/19   Renne Crigler, PA-C  tiZANidine (ZANAFLEX) 2 MG tablet Take 1-2 tablets (2-4 mg total) by mouth every 6 (six) hours as needed for muscle spasms. 05/01/19   Hilts, Casimiro Needle, MD    Allergies    Patient has no known allergies.  Review of Systems  Review of Systems  Constitutional: Negative for chills and fever.  HENT: Negative for congestion, rhinorrhea, sinus pain and sneezing.   Respiratory: Negative for cough and shortness of breath.   Cardiovascular: Positive for chest pain.  Gastrointestinal: Negative for abdominal distention, abdominal pain, diarrhea, nausea and vomiting.  Genitourinary: Negative for dysuria.  Musculoskeletal: Positive for arthralgias, back pain and neck pain. Negative for neck stiffness.  Skin: Negative for rash and wound.  Neurological: Negative for dizziness, tremors, seizures, syncope, speech difficulty, weakness, light-headedness, numbness and headaches.       "Static" sensation in his upper extremities   Psychiatric/Behavioral: Negative for confusion. The patient is nervous/anxious.     Physical Exam Updated Vital Signs BP (!) 147/72 (BP Location: Right Arm)   Pulse 78   Temp 98.7 F (37.1 C) (Oral)   Resp 18   Ht 6\' 1"  (1.854 m)   Wt 111.1 kg   SpO2 100%   BMI 32.32 kg/m   Physical Exam  ED Results / Procedures / Treatments   Labs (all labs ordered are listed, but only abnormal results are displayed) Labs Reviewed  BASIC METABOLIC PANEL - Abnormal; Notable for the following components:      Result Value   Glucose, Bld 114 (*)    All other components within normal limits  CBC - Abnormal; Notable for the following components:   Hemoglobin 17.2 (*)    All other components within normal limits  TROPONIN I (HIGH SENSITIVITY)  TROPONIN I (HIGH SENSITIVITY)    EKG EKG Interpretation  Date/Time:  Monday May 08 2019 15:50:14 EST Ventricular Rate:  94 PR Interval:  134 QRS Duration: 82 QT Interval:  322 QTC Calculation: 402 R Axis:   101 Text Interpretation: Normal sinus rhythm with sinus arrhythmia Rightward axis Nonspecific T wave abnormality Abnormal ECG No STEMI Confirmed by Octaviano Glow [phone]) on 05/08/2019 8:39:11 PM   Radiology DG Chest 2 View  Result Date: 05/08/2019 CLINICAL DATA:  Chest pain EXAM: CHEST - 2 VIEW COMPARISON:  None. FINDINGS: The heart size and mediastinal contours are within normal limits. Both lungs are clear. The visualized skeletal structures are unremarkable. IMPRESSION: No active cardiopulmonary disease. Electronically Signed   By: Inez Catalina M.D.   On: 05/08/2019 16:36    Procedures Procedures (including critical care time)  Medications Ordered in ED Medications  sodium chloride flush (NS) 0.9 % injection 3 mL (has no administration in time range)    ED Course  I have reviewed the triage vital signs and the nursing notes.  Pertinent labs & imaging results that were available during my care of the patient were reviewed by  me and considered in my medical decision making (see chart for details).  Clinical Course as of May 07 2146  Mon May 08, 2019  2130 Patient sent from urgent care for abnormal EKG in the setting of abnormal sensation of the chest, patient of all extremities.  Patient had a work-up which was reassuring including 2 - troponins and unremarkable EKG.  EKG was reviewed by myself and Dr. Langston Masker.  Patient has normal neurological exam and appears well and stable.  Mildly hypertensive.  Patient does admit nicotine, excessive caffeine as well as the use of prednisone treatment currently and just before up arrival.   [KM]    Clinical Course User Index [KM] Kristine Royal   MDM Rules/Calculators/A&P  Based on review of vitals, medical screening exam, lab work and/or imaging, there does not appear to be an acute, emergent etiology for the patient's symptoms. Counseled pt on good return precautions and encouraged both PCP and ED follow-up as needed.  Prior to discharge, I also discussed incidental imaging findings with patient in detail and advised appropriate, recommended follow-up in detail.  Clinical Impression: 1. Elevated blood pressure reading   2. Paresthesia     Disposition: Discharge  Prior to providing a prescription for a controlled substance, I independently reviewed the patient's recent prescription history on the [place] Controlled Substance Reporting System. The patient had no recent or regular prescriptions and was deemed appropriate for a brief, less than 3 day prescription of narcotic for acute analgesia.  This note was prepared with assistance of Conservation officer, historic buildings. Occasional wrong-word or sound-a-like substitutions may have occurred due to the inherent limitations of voice recognition software.  Final Clinical Impression(s) / ED Diagnoses Final diagnoses:  Elevated blood pressure reading  Paresthesia    Rx / DC Orders ED  Discharge Orders    None       Jeral Pinch 05/08/19 2148    Terald Sleeper, MD 05/09/19 1322

## 2019-05-08 NOTE — ED Notes (Signed)
Patient verbalizes understanding of discharge instructions. Opportunity for questioning and answers were provided. Armband removed by staff, pt discharged from ED. Pt. ambulatory and discharged home.  

## 2019-05-08 NOTE — ED Triage Notes (Addendum)
Pt presents to [facility] for assessment of a tingling/pressure sensation to his face/sinus 2 days ago.  Yesterday he began to feel the same sensation in his bilateral arms.  Patient states he has had a few sharp pains in his chest today.  States today PTA he felt like his body went "cold", and he had an intense build up of pressure which was causing him a headache which made it difficult for him focus his attention.  Patient states he was sitting at his desk when this happened, and took his HR and it was "130-160".   States he has a wrist BP monitor, and it was reading his BP at 220/130s.  Pt states he took his naproxen, steroid and muscle relaxer and things seems to have eased off at this time.  Pt tested positive for COVID  3-4 weeks ago.  Patient is sniffling in the room, which he says is new since COVId.  Pt complains in room of "slow motion" affect when he looks around.  Pt does state that he has been under more stress the past few days, and had quit smoking 55 days ago, but yesterday he smoked about a half a pack, today he has done the same.

## 2019-05-12 ENCOUNTER — Encounter: Payer: Self-pay | Admitting: Family Medicine

## 2019-05-12 ENCOUNTER — Ambulatory Visit (INDEPENDENT_AMBULATORY_CARE_PROVIDER_SITE_OTHER): Payer: Self-pay | Admitting: Family Medicine

## 2019-05-12 ENCOUNTER — Other Ambulatory Visit: Payer: Self-pay

## 2019-05-12 DIAGNOSIS — M25552 Pain in left hip: Secondary | ICD-10-CM

## 2019-05-12 NOTE — Progress Notes (Signed)
   Office Visit Note   Patient: Randy Barnett           Date of Birth: 07-Mar-1996           MRN: [id number] Visit Date: 05/12/2019 Requested by: No referring provider defined for this encounter. PCP: Patient, No Pcp Per  Subjective: Chief Complaint  Patient presents with  . Lower Back - Pain    Pain in the middle of the lower back is better. Still has issue with the left hamstring and left buttock area. Feels like a "fire ball" in the buttock and then pain down in the hamstring.    HPI: He is here with left posterior hip pain.  Back pain has improved, but he continues to have intermittent episodes of left posterior hip pain that "feels like a fire ball" in the buttocks area and radiates into the hamstring.  Pain does not radiate below, no numbness or weakness and no bowel or bladder dysfunction.  It feels better to bend forward and stretch his hamstring.  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  He will be starting this next week.  He had an episode of chest discomfort and went to the ER recently.  Symptoms seem to have resolved.               ROS:   All other systems were reviewed and are negative.  Objective: Vital Signs: There were no vitals taken for this visit.  Physical Exam:  General:  Alert and oriented, in no acute distress. Pulm:  Breathing unlabored. Psy:  Normal mood, congruent affect. Skin: No rash. Left hip: He has a tender trigger point in the left posterior hip to deep palpation.  Not exactly in the piriformis, but just a little bit above.  Piriformis stretch is equivocal, straight leg raise is negative.  Lower extremity strength and reflexes are normal.  Imaging: None today  Assessment & Plan: 1.  Left posterior hip pain, possibly muscular/myofascial. -Deep tissue massage using a tennis ball.  Home stretches. -Lumbar MRI scan if fails to improve.  2.  Recent episode of chest pain with apparently  abnormal EKG -We will notify me if symptoms recur and I will send him to a cardiologist for further evaluation.     Procedures: No procedures performed  No notes on file     PMFS History: There are no problems to display for this patient.  History reviewed. No pertinent past medical history.  Family History  Problem Relation Age of Onset  . Diabetes Father     History reviewed. No pertinent surgical history. Social History   Occupational History  . Occupation: Curator    Comment: would like to re-join [employer] (previously in [employer])  Tobacco Use  . Smoking status: Current Every Day Smoker    Packs/day: 1.00    Types: Cigarettes  . Smokeless tobacco: Current User    Types: Snuff, Chew  Substance and Sexual Activity  . Alcohol use: No    Comment: previously 1-1.5 cases per week  . Drug use: No  . Sexual activity: Yes    Partners: Female    Birth control/protection: Condom

## 2020-11-03 ENCOUNTER — Other Ambulatory Visit: Payer: Self-pay

## 2020-11-03 ENCOUNTER — Ambulatory Visit (HOSPITAL_COMMUNITY)
Admission: EM | Admit: 2020-11-03 | Discharge: 2020-11-03 | Disposition: A | Discharge status: Home / Self Care | Payer: Self-pay | Attending: Medical Oncology | Admitting: Medical Oncology

## 2020-11-03 ENCOUNTER — Encounter (HOSPITAL_COMMUNITY): Payer: Self-pay | Admitting: *Deleted

## 2020-11-03 DIAGNOSIS — J039 Acute tonsillitis, unspecified: Secondary | ICD-10-CM | POA: Insufficient documentation

## 2020-11-03 LAB — POCT RAPID STREP A, ED / UC: Streptococcus, Group A Screen (Direct): NEGATIVE

## 2020-11-03 NOTE — ED Provider Notes (Addendum)
[facility]    CSN: [id number] Arrival date & time: 11/03/20  1543      History   Chief Complaint Chief Complaint  Patient presents with   tonsil infection    HPI Randy Barnett is a 25 y.o. male.   HPI  Sore Throat: Pt reports a sore throat and seeing exudate on his tonsils for the past 2-3 days. Recently got out of quarantine from having COVID-19. He states that pain is about a 2/10 in nature. Scratchy like throat.  He has not tried anything for symptoms.  He denies any fevers over the past few days, vomiting or abdominal pain.  He has not had any new sexual partners but does state that his sexual partner also had very similar symptoms recently and so he would like STI testing of his throat. * pt actively playing on his phone during visit and exam.   History reviewed. No pertinent past medical history.  There are no problems to display for this patient.   History reviewed. No pertinent surgical history.     Home Medications    Prior to Admission medications   Medication Sig Start Date End Date Taking? Authorizing Provider  benzonatate (TESSALON) 100 MG capsule Take 1 capsule (100 mg total) by mouth every 8 (eight) hours. 04/10/19   Hall-Potvin, [place], PA-C  lidocaine (LIDODERM) 5 % Place 1 patch onto the skin daily. Remove & Discard patch within 12 hours or as directed by MD 04/13/19   Henderly, Britni A, PA-C  lidocaine (XYLOCAINE) 2 % solution Use as directed 15 mLs in the mouth or throat as needed for mouth pain. 04/10/19   Hall-Potvin, [place], PA-C  methocarbamol (ROBAXIN) 500 MG tablet Take 1 tablet (500 mg total) by mouth 2 (two) times daily. Patient not taking: Reported on 05/12/2019 04/13/19   Henderly, Britni A, PA-C  naproxen (NAPROSYN) 500 MG tablet Take 1 tablet (500 mg total) by mouth 2 (two) times daily. 04/13/19   Henderly, Britni A, PA-C  predniSONE (DELTASONE) 20 MG tablet 3 Tabs PO Days 1-3, then 2 tabs PO Days 4-6, then 1 tab PO Day 7-9, then  Half Tab PO Day 10-12 Patient not taking: Reported on 05/12/2019 04/28/19   Renne Crigler, PA-C  tiZANidine (ZANAFLEX) 2 MG tablet Take 1-2 tablets (2-4 mg total) by mouth every 6 (six) hours as needed for muscle spasms. 05/01/19   Hilts, Casimiro Needle, MD    Family History Family History  Problem Relation Age of Onset   Diabetes Father     Social History Social History   Tobacco Use   Smoking status: Every Day    Packs/day: 1.00    Types: Cigarettes   Smokeless tobacco: Current    Types: Snuff, Chew  Vaping Use   Vaping Use: Former  Substance Use Topics   Alcohol use: No    Comment: previously 1-1.5 cases per week   Drug use: No     Allergies   Patient has no known allergies.   Review of Systems Review of Systems  As stated above in HPI Physical Exam Triage Vital Signs ED Triage Vitals  Enc Vitals Group     BP 11/03/20 1620 129/83     Pulse Rate 11/03/20 1620 72     Resp 11/03/20 1620 18     Temp 11/03/20 1620 98.9 F (37.2 C)     Temp Source 11/03/20 1620 Oral     SpO2 11/03/20 1620 99 %     Weight --  Height --      Head Circumference --      Peak Flow --      Pain Score 11/03/20 1621 0     Pain Loc --      Pain Edu? --      Excl. in GC? --    No data found.  Updated Vital Signs BP 129/83   Pulse 72   Temp 98.9 F (37.2 C) (Oral)   Resp 18   SpO2 99%   Physical Exam Vitals and nursing note reviewed.  Constitutional:      General: He is not in acute distress.    Appearance: Normal appearance. He is not ill-appearing, toxic-appearing or diaphoretic.     Comments: No abnormality of voice  HENT:     Head: Normocephalic and atraumatic.     Right Ear: Tympanic membrane normal.     Left Ear: Tympanic membrane normal.     Nose: Nose normal.     Mouth/Throat:     Mouth: Mucous membranes are moist.     Pharynx: Oropharyngeal exudate and posterior oropharyngeal erythema present.     Comments: Tonsils 2+ bilaterally. No evidence of tonsillar abscess.  Airway is clear uvula is midline Eyes:     Extraocular Movements: Extraocular movements intact.     Pupils: Pupils are equal, round, and reactive to light.  Cardiovascular:     Rate and Rhythm: Normal rate and regular rhythm.     Heart sounds: Normal heart sounds.  Pulmonary:     Effort: Pulmonary effort is normal.     Breath sounds: Normal breath sounds.  Musculoskeletal:     Cervical back: Normal range of motion and neck supple.  Lymphadenopathy:     Cervical: No cervical adenopathy.  Skin:    Findings: No rash.  Neurological:     Mental Status: He is oriented to person, place, and time.     UC Treatments / Results  Labs (all labs ordered are listed, but only abnormal results are displayed) Labs Reviewed - No data to display  EKG   Radiology No results found.  Procedures Procedures (including critical care time)  Medications Ordered in UC Medications - No data to display  Initial Impression / Assessment and Plan / UC Course  I have reviewed the triage vital signs and the nursing notes.  Pertinent labs & imaging results that were available during my care of the patient were reviewed by me and considered in my medical decision making (see chart for details).     New.  Testing pending.  If his strep test is negative we are going to assume that this is viral secondary to COVID until his cytology results.  At this time we discussed salt water gargle or sore throat lozenges as needed. Final Clinical Impressions(s) / UC Diagnoses   Final diagnoses:  None   Discharge Instructions   None    ED Prescriptions   None    PDMP not reviewed this encounter.   Rushie Chestnut, PA-C 11/03/20 1713    Rushie Chestnut, PA-C 11/03/20 [phone]

## 2020-11-03 NOTE — ED Triage Notes (Signed)
Pt reports he sees pus on this tonsils.

## 2020-11-04 LAB — CYTOLOGY, (ORAL, ANAL, URETHRAL) ANCILLARY ONLY
Chlamydia: NEGATIVE
Comment: NEGATIVE
Comment: NEGATIVE
Comment: NORMAL
Neisseria Gonorrhea: NEGATIVE
Trichomonas: NEGATIVE

## 2020-11-06 LAB — CULTURE, GROUP A STREP (THRC)

## 2021-05-13 ENCOUNTER — Ambulatory Visit
Admission: EM | Admit: 2021-05-13 | Discharge: 2021-05-13 | Disposition: A | Discharge status: Home / Self Care | Payer: Self-pay | Attending: Physician Assistant | Admitting: Physician Assistant

## 2021-05-13 ENCOUNTER — Other Ambulatory Visit: Payer: Self-pay

## 2021-05-13 ENCOUNTER — Encounter: Payer: Self-pay | Admitting: Emergency Medicine

## 2021-05-13 DIAGNOSIS — R1013 Epigastric pain: Secondary | ICD-10-CM

## 2021-05-13 DIAGNOSIS — J069 Acute upper respiratory infection, unspecified: Secondary | ICD-10-CM

## 2021-05-13 MED ORDER — ONDANSETRON 4 MG PO TBDP: 4.0000 mg | ORAL_TABLET | Freq: Three times a day (TID) | ORAL | 0 refills | Status: DC | PRN

## 2021-05-13 NOTE — ED Triage Notes (Signed)
Pt here for nausea and abd fullness and pain x 3 days; pt sts some nasal congestion and chest congestion also

## 2021-05-13 NOTE — ED Provider Notes (Signed)
[facility]    CSN: [id number] Arrival date & time: 05/13/21  [id number]      History   Chief Complaint Chief Complaint  Patient presents with   Nasal Congestion   Nausea    HPI Randy Barnett is a 26 y.o. male.   Patient here today for evaluation of nasal congestion, sore throat, cough that started 3 days ago.  He also reports nausea but no vomiting.  He states he has had some epigastric discomfort.  He notes he has had worsened acid reflux for the last month or so and this seems to be exacerbated by current illness.  He has had chills and sweats but unknown if he has had fever.  He denies diarrhea.  Patient does not report any treatment for symptoms.  The history is provided by the patient.   History reviewed. No pertinent past medical history.  There are no problems to display for this patient.   History reviewed. No pertinent surgical history.     Home Medications    Prior to Admission medications   Medication Sig Start Date End Date Taking? Authorizing Provider  ondansetron (ZOFRAN-ODT) 4 MG disintegrating tablet Take 1 tablet (4 mg total) by mouth every 8 (eight) hours as needed. 05/13/21  Yes Francene Finders, PA-C  benzonatate (TESSALON) 100 MG capsule Take 1 capsule (100 mg total) by mouth every 8 (eight) hours. Patient not taking: Reported on 05/13/2021 04/10/19   Hall-Potvin, [place], PA-C  lidocaine (LIDODERM) 5 % Place 1 patch onto the skin daily. Remove & Discard patch within 12 hours or as directed by MD 04/13/19   Henderly, Britni A, PA-C  lidocaine (XYLOCAINE) 2 % solution Use as directed 15 mLs in the mouth or throat as needed for mouth pain. 04/10/19   Hall-Potvin, [place], PA-C  naproxen (NAPROSYN) 500 MG tablet Take 1 tablet (500 mg total) by mouth 2 (two) times daily. 04/13/19   Henderly, Britni A, PA-C  tiZANidine (ZANAFLEX) 2 MG tablet Take 1-2 tablets (2-4 mg total) by mouth every 6 (six) hours as needed for muscle spasms. 05/01/19   Hilts,  Legrand Como, MD    Family History Family History  Problem Relation Age of Onset   Diabetes Father     Social History Social History   Tobacco Use   Smoking status: Every Day    Packs/day: 1.00    Types: Cigarettes   Smokeless tobacco: Current    Types: Snuff, Chew  Vaping Use   Vaping Use: Former  Substance Use Topics   Alcohol use: No    Comment: previously 1-1.5 cases per week   Drug use: No     Allergies   Patient has no known allergies.   Review of Systems Review of Systems  Constitutional:  Positive for chills and diaphoresis. Negative for fever.  HENT:  Positive for congestion and sore throat. Negative for ear pain.   Eyes:  Negative for discharge and redness.  Respiratory:  Positive for cough. Negative for shortness of breath.   Gastrointestinal:  Positive for abdominal pain and nausea. Negative for diarrhea and vomiting.    Physical Exam Triage Vital Signs ED Triage Vitals [05/13/21 0938]  Enc Vitals Group     BP (!) 160/85     Pulse Rate 84     Resp 18     Temp 98.2 F (36.8 C)     Temp Source Oral     SpO2 97 %     Weight  Height      Head Circumference      Peak Flow      Pain Score 5     Pain Loc      Pain Edu?      Excl. in [place]?    No data found.  Updated Vital Signs BP (!) 160/85 (BP Location: Left Arm)    Pulse 84    Temp 98.2 F (36.8 C) (Oral)    Resp 18    SpO2 97%      Physical Exam Vitals and nursing note reviewed.  Constitutional:      General: He is not in acute distress.    Appearance: Normal appearance. He is not ill-appearing.  HENT:     Head: Normocephalic and atraumatic.     Nose: Congestion present.     Mouth/Throat:     Mouth: Mucous membranes are moist.     Pharynx: Oropharynx is clear. No oropharyngeal exudate or posterior oropharyngeal erythema.  Eyes:     Conjunctiva/sclera: Conjunctivae normal.  Cardiovascular:     Rate and Rhythm: Normal rate and regular rhythm.     Heart sounds: Normal heart sounds.  No murmur heard. Pulmonary:     Effort: Pulmonary effort is normal. No respiratory distress.     Breath sounds: Normal breath sounds. No wheezing, rhonchi or rales.  Skin:    General: Skin is warm and dry.  Neurological:     Mental Status: He is alert.  Psychiatric:        Mood and Affect: Mood normal.        Thought Content: Thought content normal.     UC Treatments / Results  Labs (all labs ordered are listed, but only abnormal results are displayed) Labs Reviewed  COVID-19, FLU A+B NAA  CBC WITH DIFFERENTIAL/PLATELET  COMPREHENSIVE METABOLIC PANEL  AMYLASE  LIPASE  H. PYLORI ANTIBODY, IGG    EKG   Radiology No results found.  Procedures Procedures (including critical care time)  Medications Ordered in UC Medications - No data to display  Initial Impression / Assessment and Plan / UC Course  I have reviewed the triage vital signs and the nursing notes.  Pertinent labs & imaging results that were available during my care of the patient were reviewed by me and considered in my medical decision making (see chart for details).    I suspect viral etiology of symptoms and will send in Zofran for treatment of nausea.  Recommended rest and increase fluids.  We will also screen to rule out pancreatitis, H. pylori, other concerning causes of epigastric pain.  Will await results for further recommendation.  Final Clinical Impressions(s) / UC Diagnoses   Final diagnoses:  Acute upper respiratory infection  Abdominal pain, epigastric   Discharge Instructions   None    ED Prescriptions     Medication Sig Dispense Auth. Provider   ondansetron (ZOFRAN-ODT) 4 MG disintegrating tablet Take 1 tablet (4 mg total) by mouth every 8 (eight) hours as needed. 20 tablet Francene Finders, PA-C      PDMP not reviewed this encounter.   Francene Finders, PA-C 05/13/21 1054

## 2021-05-14 LAB — AMYLASE: Amylase: 93 U/L (ref 31–110)

## 2021-05-14 LAB — CBC WITH DIFFERENTIAL/PLATELET
Basophils Absolute: 0 10*3/uL (ref 0.0–0.2)
Basos: 1 %
EOS (ABSOLUTE): 0 10*3/uL (ref 0.0–0.4)
Eos: 0 %
Hematocrit: 49.1 % (ref 37.5–51.0)
Hemoglobin: 16.7 g/dL (ref 13.0–17.7)
Immature Grans (Abs): 0 10*3/uL (ref 0.0–0.1)
Immature Granulocytes: 1 %
Lymphocytes Absolute: 0.9 10*3/uL (ref 0.7–3.1)
Lymphs: 17 %
MCH: 30.7 pg (ref 26.6–33.0)
MCHC: 34 g/dL (ref 31.5–35.7)
MCV: 90 fL (ref 79–97)
Monocytes Absolute: 0.8 10*3/uL (ref 0.1–0.9)
Monocytes: 16 %
Neutrophils Absolute: 3.2 10*3/uL (ref 1.4–7.0)
Neutrophils: 65 %
Platelets: 166 10*3/uL (ref 150–450)
RBC: 5.44 x10E6/uL (ref 4.14–5.80)
RDW: 12.4 % (ref 11.6–15.4)
WBC: 5 10*3/uL (ref 3.4–10.8)

## 2021-05-14 LAB — COMPREHENSIVE METABOLIC PANEL
ALT: 48 IU/L — ABNORMAL HIGH (ref 0–44)
AST: 30 IU/L (ref 0–40)
Albumin/Globulin Ratio: 2 (ref 1.2–2.2)
Albumin: 4.7 g/dL (ref 4.1–5.2)
Alkaline Phosphatase: 82 IU/L (ref 44–121)
BUN/Creatinine Ratio: 5 — ABNORMAL LOW (ref 9–20)
BUN: 6 mg/dL (ref 6–20)
Bilirubin Total: 0.5 mg/dL (ref 0.0–1.2)
CO2: 26 mmol/L (ref 20–29)
Calcium: 9.5 mg/dL (ref 8.7–10.2)
Chloride: 101 mmol/L (ref 96–106)
Creatinine, Ser: 1.2 mg/dL (ref 0.76–1.27)
Globulin, Total: 2.4 g/dL (ref 1.5–4.5)
Glucose: 104 mg/dL — ABNORMAL HIGH (ref 70–99)
Potassium: 4.2 mmol/L (ref 3.5–5.2)
Sodium: 137 mmol/L (ref 134–144)
Total Protein: 7.1 g/dL (ref 6.0–8.5)
eGFR: 85 mL/min/{1.73_m2} (ref >59)

## 2021-05-14 LAB — COVID-19, FLU A+B NAA
Influenza A, NAA: NOT DETECTED
Influenza B, NAA: NOT DETECTED
SARS-CoV-2, NAA: NOT DETECTED

## 2021-05-14 LAB — H. PYLORI ANTIBODY, IGG: H. pylori, IgG AbS: 0.12 Index Value (ref 0.00–0.79)

## 2021-05-14 LAB — LIPASE: Lipase: 17 U/L (ref 13–78)

## 2021-05-15 ENCOUNTER — Telehealth (HOSPITAL_COMMUNITY): Payer: Self-pay | Admitting: Emergency Medicine

## 2021-05-15 NOTE — Telephone Encounter (Signed)
Patient left voicemail asking for someone to review his results with him and his next steps.   Blood work is unremarkable, he was concerned for elevated glucose, but it was a non-fasting lab.   Attempted to reach patient x 1, LVM

## 2021-06-30 ENCOUNTER — Encounter (HOSPITAL_COMMUNITY): Payer: Self-pay

## 2021-06-30 ENCOUNTER — Ambulatory Visit (HOSPITAL_COMMUNITY)
Admission: EM | Admit: 2021-06-30 | Discharge: 2021-06-30 | Disposition: A | Discharge status: Home / Self Care | Payer: Self-pay | Attending: Family Medicine | Admitting: Family Medicine

## 2021-06-30 DIAGNOSIS — J029 Acute pharyngitis, unspecified: Secondary | ICD-10-CM

## 2021-06-30 LAB — POCT RAPID STREP A, ED / UC: Streptococcus, Group A Screen (Direct): NEGATIVE

## 2021-06-30 MED ORDER — AMOXICILLIN 875 MG PO TABS: 875.0000 mg | ORAL_TABLET | Freq: Two times a day (BID) | ORAL | 0 refills | Status: AC

## 2021-06-30 NOTE — ED Provider Notes (Signed)
?[facility] ? ? ? ?CSN: [id number] ?Arrival date & time: 06/30/21  1828 ? ? ?  ? ?History   ?Chief Complaint ?Chief Complaint  ?Patient presents with  ? Sore Throat  ? Fever  ? Chills  ? ? ?HPI ?Randy Barnett is a 26 y.o. male.  ? ? ?Sore Throat ? ?Fever ?Here for sore throat and fever and chills that began March 31.  Fever is actually better today and only low-grade.  No nasal congestion or cough.  No vomiting or diarrhea. ? ?History reviewed. No pertinent past medical history. ? ?There are no problems to display for this patient. ? ? ?History reviewed. No pertinent surgical history. ? ? ? ? ?Home Medications   ? ?Prior to Admission medications   ?Medication Sig Start Date End Date Taking? Authorizing Provider  ?amoxicillin (AMOXIL) 875 MG tablet Take 1 tablet (875 mg total) by mouth 2 (two) times daily for 10 days. 06/30/21 07/10/21 Yes Kirsten Mckone, Janace Aris, MD  ?naproxen (NAPROSYN) 500 MG tablet Take 1 tablet (500 mg total) by mouth 2 (two) times daily. 04/13/19   Henderly, Britni A, PA-C  ?ondansetron (ZOFRAN-ODT) 4 MG disintegrating tablet Take 1 tablet (4 mg total) by mouth every 8 (eight) hours as needed. 05/13/21   Tomi Bamberger, PA-C  ?tiZANidine (ZANAFLEX) 2 MG tablet Take 1-2 tablets (2-4 mg total) by mouth every 6 (six) hours as needed for muscle spasms. 05/01/19   Hilts, Casimiro Needle, MD  ? ? ?Family History ?Family History  ?Problem Relation Age of Onset  ? Diabetes Father   ? ? ?Social History ?Social History  ? ?Tobacco Use  ? Smoking status: Every Day  ?  Packs/day: 1.00  ?  Types: Cigarettes  ? Smokeless tobacco: Current  ?  Types: Snuff, Chew  ?Vaping Use  ? Vaping Use: Former  ?Substance Use Topics  ? Alcohol use: No  ?  Comment: previously 1-1.5 cases per week  ? Drug use: No  ? ? ? ?Allergies   ?Patient has no known allergies. ? ? ?Review of Systems ?Review of Systems  ?Constitutional:  Positive for fever.  ? ? ?Physical Exam ?Triage Vital Signs ?ED Triage Vitals  ?Enc Vitals Group  ?   BP  06/30/21 1941 (!) 135/91  ?   Pulse Rate 06/30/21 1941 80  ?   Resp 06/30/21 1941 17  ?   Temp 06/30/21 1941 98.1 ?F (36.7 ?C)  ?   Temp Source 06/30/21 1941 Oral  ?   SpO2 06/30/21 1941 98 %  ?   Weight --   ?   Height --   ?   Head Circumference --   ?   Peak Flow --   ?   Pain Score 06/30/21 1940 5  ?   Pain Loc --   ?   Pain Edu? --   ?   Excl. in GC? --   ? ?No data found. ? ?Updated Vital Signs ?BP (!) 135/91 (BP Location: Left Arm)   Pulse 80   Temp 98.1 ?F (36.7 ?C) (Oral)   Resp 17   SpO2 98%  ? ?Visual Acuity ?Right Eye Distance:   ?Left Eye Distance:   ?Bilateral Distance:   ? ?Right Eye Near:   ?Left Eye Near:    ?Bilateral Near:    ? ?Physical Exam ?Vitals reviewed.  ?Constitutional:   ?   General: He is not in acute distress. ?   Appearance: He is not toxic-appearing.  ?HENT:  ?  Right Ear: Tympanic membrane and ear canal normal.  ?   Left Ear: Tympanic membrane and ear canal normal.  ?   Nose: Nose normal.  ?   Mouth/Throat:  ?   Mouth: Mucous membranes are moist.  ?   Comments: Tonsils are erythematous with white mucus in the crypts.  They are also swollen to about 3+ hypertrophy.  The posterior oropharynx is erythematous with cobblestoning ?Eyes:  ?   Extraocular Movements: Extraocular movements intact.  ?   Conjunctiva/sclera: Conjunctivae normal.  ?   Pupils: Pupils are equal, round, and reactive to light.  ?Cardiovascular:  ?   Rate and Rhythm: Normal rate and regular rhythm.  ?   Heart sounds: No murmur heard. ?Pulmonary:  ?   Effort: Pulmonary effort is normal.  ?   Breath sounds: No stridor. No wheezing, rhonchi or rales.  ?Musculoskeletal:  ?   Cervical back: Neck supple.  ?Lymphadenopathy:  ?   Cervical: No cervical adenopathy.  ?Skin: ?   Capillary Refill: Capillary refill takes less than 2 seconds.  ?   Coloration: Skin is not jaundiced or pale.  ?Neurological:  ?   General: No focal deficit present.  ?   Mental Status: He is alert and oriented to person, place, and time.   ?Psychiatric:     ?   Behavior: Behavior normal.  ? ? ? ?UC Treatments / Results  ?Labs ?(all labs ordered are listed, but only abnormal results are displayed) ?Labs Reviewed  ?POCT RAPID STREP A, ED / UC  ? ? ?EKG ? ? ?Radiology ?No results found. ? ?Procedures ?Procedures (including critical care time) ? ?Medications Ordered in UC ?Medications - No data to display ? ?Initial Impression / Assessment and Plan / UC Course  ?I have reviewed the triage vital signs and the nursing notes. ? ?Pertinent labs & imaging results that were available during my care of the patient were reviewed by me and considered in my medical decision making (see chart for details). ? ?  ? ?Rapid strep is negative.  With the appearance of his throat and the prevalence in the community of strep throat right now, I am going to treat with amoxicillin ?Final Clinical Impressions(s) / UC Diagnoses  ? ?Final diagnoses:  ?Acute pharyngitis, unspecified etiology  ? ? ? ?Discharge Instructions   ? ?  ?Your rapid strep test was negative; throat culture is sent.  Staff will call you if it is positive. ? ?Take amoxicillin 875 mg--1 tab twice daily for 10 days. ? ?Advil or Tylenol as needed for pain ? ? ? ? ? ? ? ? ?ED Prescriptions   ? ? Medication Sig Dispense Auth. Provider  ? amoxicillin (AMOXIL) 875 MG tablet Take 1 tablet (875 mg total) by mouth 2 (two) times daily for 10 days. 20 tablet Zenia Resides, MD  ? ?  ? ?PDMP not reviewed this encounter. ?  ?Zenia Resides, MD ?06/30/21 2014 ? ?

## 2021-06-30 NOTE — ED Triage Notes (Signed)
Pt presents with a fever, sore throat x 3 days.  ? ?Pt states he had a low grade fever today.  ?

## 2021-06-30 NOTE — Discharge Instructions (Addendum)
Your rapid strep test was negative; throat culture is sent.  Staff will call you if it is positive. ? ?Take amoxicillin 875 mg--1 tab twice daily for 10 days. ? ?Advil or Tylenol as needed for pain ? ? ? ? ?

## 2021-09-11 ENCOUNTER — Encounter (HOSPITAL_BASED_OUTPATIENT_CLINIC_OR_DEPARTMENT_OTHER): Payer: Self-pay

## 2021-09-11 ENCOUNTER — Other Ambulatory Visit: Payer: Self-pay

## 2021-09-11 ENCOUNTER — Emergency Department (HOSPITAL_BASED_OUTPATIENT_CLINIC_OR_DEPARTMENT_OTHER): Payer: Self-pay

## 2021-09-11 ENCOUNTER — Emergency Department (HOSPITAL_BASED_OUTPATIENT_CLINIC_OR_DEPARTMENT_OTHER)
Admission: EM | Admit: 2021-09-11 | Discharge: 2021-09-11 | Disposition: A | Discharge status: Home / Self Care | Payer: Self-pay | Attending: Emergency Medicine | Admitting: Emergency Medicine

## 2021-09-11 DIAGNOSIS — R202 Paresthesia of skin: Secondary | ICD-10-CM | POA: Insufficient documentation

## 2021-09-11 DIAGNOSIS — R0602 Shortness of breath: Secondary | ICD-10-CM | POA: Insufficient documentation

## 2021-09-11 DIAGNOSIS — R079 Chest pain, unspecified: Secondary | ICD-10-CM | POA: Insufficient documentation

## 2021-09-11 DIAGNOSIS — R42 Dizziness and giddiness: Secondary | ICD-10-CM | POA: Insufficient documentation

## 2021-09-11 LAB — BASIC METABOLIC PANEL
Anion gap: 7 (ref 5–15)
BUN: 13 mg/dL (ref 6–20)
CO2: 27 mmol/L (ref 22–32)
Calcium: 9.4 mg/dL (ref 8.9–10.3)
Chloride: 103 mmol/L (ref 98–111)
Creatinine, Ser: 1.24 mg/dL (ref 0.61–1.24)
GFR, Estimated: >60 mL/min (ref >60)
Glucose, Bld: 103 mg/dL — ABNORMAL HIGH (ref 70–99)
Potassium: 4.1 mmol/L (ref 3.5–5.1)
Sodium: 137 mmol/L (ref 135–145)

## 2021-09-11 LAB — CBC
HCT: 47.4 % (ref 39.0–52.0)
Hemoglobin: 16.3 g/dL (ref 13.0–17.0)
MCH: 30.2 pg (ref 26.0–34.0)
MCHC: 34.4 g/dL (ref 30.0–36.0)
MCV: 87.8 fL (ref 80.0–100.0)
Platelets: 186 10*3/uL (ref 150–400)
RBC: 5.4 MIL/uL (ref 4.22–5.81)
RDW: 12.1 % (ref 11.5–15.5)
WBC: 5.4 10*3/uL (ref 4.0–10.5)
nRBC: 0 % (ref 0.0–0.2)

## 2021-09-11 LAB — TROPONIN I (HIGH SENSITIVITY)
Troponin I (High Sensitivity): 2 ng/L (ref <18)
Troponin I (High Sensitivity): <2 ng/L (ref <18)

## 2021-09-11 MED ORDER — FAMOTIDINE 20 MG PO TABS: 20.0000 mg | ORAL_TABLET | Freq: Two times a day (BID) | ORAL | 0 refills | Status: DC

## 2021-09-11 MED ORDER — FAMOTIDINE 20 MG PO TABS
20.0000 mg | ORAL_TABLET | Freq: Once | ORAL | Status: AC
  Administered 2021-09-11: 20 mg via ORAL
  Filled 2021-09-11: qty 1

## 2021-09-11 MED ORDER — ALUM & MAG HYDROXIDE-SIMETH 200-200-20 MG/5ML PO SUSP
30.0000 mL | Freq: Once | ORAL | Status: AC
  Administered 2021-09-11: 30 mL via ORAL
  Filled 2021-09-11: qty 30

## 2021-09-11 MED ORDER — LIDOCAINE VISCOUS HCL 2 % MT SOLN
15.0000 mL | Freq: Once | OROMUCOSAL | Status: AC
  Administered 2021-09-11: 15 mL via ORAL
  Filled 2021-09-11: qty 15

## 2021-09-11 NOTE — ED Provider Notes (Signed)
[facility] EMERGENCY DEPARTMENT Provider Note   CSN: [id number] Arrival date & time: 09/11/21  1139     History  Chief Complaint  Patient presents with   Chest Pain    Randy Barnett is a 26 y.o. male.   Chest Pain  Patient is a 26 year old male with no pertinent past medical history presented today to the emergency room with complaint of chest pain.  Tuesday afternoon - left arm tingling and achy sensation in LUE. Some chest pressure and "felt out of it" which he seems to describe as mostly fatigue  Took 2 baby asa and sat down and felt dizzy after 30 minutes. Somewhat SOB ("felt like  I had to take deeper breaths").  EMTs monitored for a while - tachy and high BP but not transported.  No other episodes of dizziness/LH.   Chest pressure since that time. No radiation. No NV now.   He states his symptoms have been constant.  They are not aggravated or mitigated by activity although he states that he has recently had some episodes of chest discomfort while having sex.  It does not seem this is a consistent issue.      Home Medications Prior to Admission medications   Medication Sig Start Date End Date Taking? Authorizing Provider  naproxen (NAPROSYN) 500 MG tablet Take 1 tablet (500 mg total) by mouth 2 (two) times daily. 04/13/19   Henderly, Britni A, PA-C  ondansetron (ZOFRAN-ODT) 4 MG disintegrating tablet Take 1 tablet (4 mg total) by mouth every 8 (eight) hours as needed. 05/13/21   Tomi Bamberger, PA-C  tiZANidine (ZANAFLEX) 2 MG tablet Take 1-2 tablets (2-4 mg total) by mouth every 6 (six) hours as needed for muscle spasms. 05/01/19   Hilts, Casimiro Needle, MD      Allergies    Patient has no known allergies.    Review of Systems   Review of Systems  Cardiovascular:  Positive for chest pain.    Physical Exam Updated Vital Signs BP 129/80   Pulse 64   Temp 98.6 F (37 C) (Oral)   Resp 20   Ht 6\' 1"  (1.854 m)   Wt 122.5 kg   SpO2 97%   BMI 35.62  kg/m  Physical Exam Vitals and nursing note reviewed.  Constitutional:      General: He is not in acute distress. HENT:     Head: Normocephalic and atraumatic.     Nose: Nose normal.     Mouth/Throat:     Mouth: Mucous membranes are moist.  Eyes:     General: No scleral icterus. Cardiovascular:     Rate and Rhythm: Normal rate and regular rhythm.     Pulses: Normal pulses.     Heart sounds: Normal heart sounds.     Comments: BL radial art pulses Pulmonary:     Effort: Pulmonary effort is normal. No respiratory distress.     Breath sounds: Normal breath sounds. No wheezing.  Abdominal:     Palpations: Abdomen is soft.     Tenderness: There is no abdominal tenderness. There is no guarding or rebound.  Musculoskeletal:     Cervical back: Normal range of motion.     Right lower leg: No edema.     Left lower leg: No edema.  Skin:    General: Skin is warm and dry.     Capillary Refill: Capillary refill takes less than 2 seconds.  Neurological:     Mental Status: He is alert. Mental  status is at baseline.  Psychiatric:        Mood and Affect: Mood normal.        Behavior: Behavior normal.     ED Results / Procedures / Treatments   Labs (all labs ordered are listed, but only abnormal results are displayed) Labs Reviewed  BASIC METABOLIC PANEL - Abnormal; Notable for the following components:      Result Value   Glucose, Bld 103 (*)    All other components within normal limits  CBC  TROPONIN I (HIGH SENSITIVITY)  TROPONIN I (HIGH SENSITIVITY)    EKG None  Radiology DG Chest 2 View  Result Date: 09/11/2021 CLINICAL DATA:  Chest pain and shortness of breath. EXAM: CHEST - 2 VIEW COMPARISON:  Chest two views 05/08/2019 FINDINGS: Cardiac silhouette and mediastinal contours are within normal limits. The lungs are clear. No pleural effusion or pneumothorax. No acute skeletal abnormality. IMPRESSION: No active cardiopulmonary disease. Electronically Signed   By: Neita Garnet M.D.   On: 09/11/2021 12:14    Procedures Procedures    Medications Ordered in ED Medications  alum & mag hydroxide-simeth (MAALOX/MYLANTA) 200-200-20 MG/5ML suspension 30 mL (30 mLs Oral Given 09/11/21 1559)    And  lidocaine (XYLOCAINE) 2 % viscous mouth solution 15 mL (15 mLs Oral Given 09/11/21 1559)  famotidine (PEPCID) tablet 20 mg (20 mg Oral Given 09/11/21 1559)    ED Course/ Medical Decision Making/ A&P                            Medical Decision Making Amount and/or Complexity of Data Reviewed Labs: ordered. Radiology: ordered.  Risk OTC drugs. Prescription drug management.   Patient is a 26 year old male with no pertinent past medical history presented today to the emergency room with complaint of chest pain.  Tuesday afternoon - left arm tingling and achy sensation in LUE. Some chest pressure and "felt out of it" which he seems to describe as mostly fatigue  Took 2 baby asa and sat down and felt dizzy after 30 minutes. Somewhat SOB ("felt like  I had to take deeper breaths").  EMTs monitored for a while - tachy and high BP but not transported.  No other episodes of dizziness/LH.   Chest pressure since that time. No radiation. No NV now.   He states his symptoms have been constant.  They are not aggravated or mitigated by activity although he states that he has recently had some episodes of chest discomfort while having sex.  It does not seem this is a consistent issue.   Physical exam unremarkable  Troponin x2 within normal limits.  BMP unremarkable CBC without leukocytosis or anemia.  I personally viewed images of chest x-ray agree with radiology read that this is without any acute abnormality.  No pneumonia or pneumothorax.  EKG with S1Q3T3 however unchanged from prior EKGs.  Patient has been seen for similar symptoms in the past.  He was referred to cardiology for his abnormal EKG however is not seen them.  We will re refer patient to cardiology  I  reassessed patient after GI cocktail and Pepcid.  Some improvement in symptoms not completely resolved however.  Would like to share decision conversation he is agreeable with plan.  Will discharge home with strict return precautions should experience any hemoptysis worsening chest pain or any difficulty breathing.  All questions answered best my ability.  Patient discharged home.  SPO2 100% on room  air no tachycardia.  Patient is PERC negative low suspicion for PE.  Final Clinical Impression(s) / ED Diagnoses Final diagnoses:  Chest pain, unspecified type    Rx / DC Orders ED Discharge Orders          Ordered    Ambulatory referral to Cardiology       Comments: If you have not heard from the Cardiology office within the next 72 hours please call [phone].   09/11/21 1743              Gailen Shelter, PA 09/11/21 2225    Tanda Rockers A, DO 09/13/21 0125

## 2021-09-11 NOTE — Discharge Instructions (Addendum)
Please follow-up with cardiology.  Please return the emergency room for any new or concerning symptoms.  I am very reassured by your work-up.  I do recommend that you take the Pepcid that I prescribed you for possible reflux as this could cause some of the symptoms you are experiencing.  Tylenol and ibuprofen for your upper leg pain.  Warm Epsom salt soaks can be helpful as well.

## 2021-09-11 NOTE — ED Triage Notes (Signed)
Started having chest pain with dizziness and arm tingling Tuesday. EMS was called and dx with panic attack. States continues to have chest pressure and sob with sharp pain in left chest intermittently.

## 2021-09-11 NOTE — ED Notes (Signed)
Discharge instructions reviewed with patient. Patient verbalizes understanding, no further questions at this time. Medications/prescriptions and follow up information provided. No acute distress noted at time of departure.  

## 2021-09-17 ENCOUNTER — Ambulatory Visit (INDEPENDENT_AMBULATORY_CARE_PROVIDER_SITE_OTHER): Payer: Self-pay | Admitting: Internal Medicine

## 2021-09-17 ENCOUNTER — Encounter: Payer: Self-pay | Admitting: Internal Medicine

## 2021-09-17 VITALS — BP 130/88 | HR 77 | Ht 73.0 in | Wt 269.0 lb

## 2021-09-17 DIAGNOSIS — R079 Chest pain, unspecified: Secondary | ICD-10-CM

## 2021-09-17 NOTE — Progress Notes (Signed)
Cardiology Office Note   Date:  09/17/2021   ID:  Randy Barnett, DOB 25-Oct-1995, MRN [id number]  PCP:  Pcp, No  Cardiologist:   Randy Pates, MD   Patient referred for CP       History of Present Illness: Randy Barnett is a 26 y.o. male with a history of chest pain.  No prior cardiac Hx    ON Tues 6/13   Driving to [place]  Grabbed a chicken sandwich and double cheese burger.  Gulped both down with no liquids.     Later developed L upper chest pressure that radiated to mid chest and R chest.   Then developed L arm achiness.   Became very stressed, "freaked out"      Noted interestingly that he couldn't burp  Wed 6/14   Chest pressure continuous     Couldn't burp Thursday   Still pressure   Occasional sharp   Went to ER   Trop neg    EKG without acute changes   Sent home on protonix.     Friday   Pressue came and went   No sharp pain    Still with some dull ache in arm Saturday (6/17)  Pressure came and went     Sunday   Minimal pressure     That night driving [place] [address]   Got pressure   Then became very anxious      Pulled over  Took 2 ASA and symptoms eased     Monday  Pressure still  Tues   Driving to [employer] something suddenly "drop down" in chest   Then immedeiately felt fine   Energy good   Has not had any since yesterday   Active   No CP exacerbated by activity    Diet:   Breakfast   Skips   Or has sausage/egg biscut LUnch   fast food Dinner  Cooks burgers/pasta  Cut out sweet drinks 1 month ago   was drinking 400 to 500 G of added sugar per day     Cut out vapes on Monday Also tob   Still chews           Current Meds  Medication Sig   Calcium Carb-Cholecalciferol (CALCIUM PLUS VITAMIN D3 PO) Take 1,000 mg by mouth in the morning, at noon, and at bedtime.   famotidine (PEPCID) 20 MG tablet Take 1 tablet (20 mg total) by mouth 2 (two) times daily.   Omega-3 Fatty Acids (OMEGA-3 FISH OIL) 1200 MG CAPS Take by mouth in the morning, at noon, and at bedtime.      Allergies:   Patient has no known allergies.   Past Medical History:  Diagnosis Date   Abnormal EKG    Acute pharyngitis    Chest pain    Cough    Dizziness    Dysuria    Elevated blood pressure reading    Fatigue    Foot pain    Low back pain    Pain in left hip    Pruritus    Scabies    SOB (shortness of breath)    Strain of lumbar region    Tonsillitis    URI (upper respiratory infection)     No past surgical history on file.   Social History:  The patient  reports that he has been smoking cigarettes. He has been smoking an average of 1 pack per day. His smokeless tobacco use includes snuff and chew.  He reports that he does not drink alcohol and does not use drugs.   Dips 1/2 can per day Vaping and smoking quit vaping after tue  Family History:  The patient's family history includes Diabetes in his father. No premature CAD     ROS:  Please see the history of present illness. All other systems are reviewed and  Negative to the above problem except as noted.    PHYSICAL EXAM: VS:  BP 130/88   Pulse 77   Ht 6\' 1"  (1.854 m)   Wt 269 lb (122 kg)   SpO2 99%   BMI 35.49 kg/m   GEN: Obese 26 yo in no acute distress  HEENT: normal  Neck: no JVD, carotid bruits= Cardiac: RRR; no murmurs  NO LE edema  Respiratory:  clear to auscultation bilaterally, normal work of breathing GI: soft, nontender, nondistended, + BS  No hepatomegaly  MS: no deformity Moving all extremities   Skin: warm and dry, no rash Neuro:  Strength and sensation are intact Psych: euthymic mood, full affect   EKG:  EKG is not ordered today.  On 6/15  NSR   87 bpm  ST change consistent with early repol   ( Sl more prominent than previous )   T wave inversion V1   Lipid Panel No results found for: "CHOL", "TRIG", "HDL", "CHOLHDL", "VLDL", "LDLCALC", "LDLDIRECT"    Wt Readings from Last 3 Encounters:  09/17/21 269 lb (122 kg)  09/11/21 270 lb (122.5 kg)  05/08/19 245 lb (111.1 kg)       ASSESSMENT AND PLAN:  1  Chest pain  Pt with long standing CP that started 6/13 after eating 2 sandwiches fast     Continuous for awhile, with neg troponin   THen intermittent.   Stopped suddenly and felt fine.    I do not think this is cardiac in origin   ? GI   ? If food getting stuck , stricture   Follow for now    IF recurs reevaluate, consider GI evaluation Counselled patient on eating slower with fluids     2  Metabolic    Pt with significant metabolic problems   Diet is not good   BUT it is better than it has been  He has cut out SSB   A significant load   Recomm:   Cut back on carbs, processed food    Whole, natural food   Plants   Low carb       3  Blood pressure.  Diastolic 88/  Follow     4  Substance abuse   Pt has stopped smoking and vapes   Congratualted   Cut back on chew   I will set to see next winter     Current medicines are reviewed at length with the patient today.  The patient does not have concerns regarding medicines.  Signed, 06-15-1987, MD  09/17/2021 5:38 PM    [facility] [address] [place], [place], [place]  [place] Phone: [phone]; Fax: [phone]

## 2021-09-17 NOTE — Patient Instructions (Addendum)
Medication Instructions:   Your physician recommends that you continue on your current medications as directed. Please refer to the Current Medication list given to you today.  *If you need a refill on your cardiac medications before your next appointment, please call your pharmacy*   Lab Work: NONE ORDERED  TODAY   If you have labs (blood work) drawn today and your tests are completely normal, you will receive your results only by: MyChart Message (if you have MyChart) OR A paper copy in the mail If you have any lab test that is abnormal or we need to change your treatment, we will call you to review the results.   Testing/Procedures:NONE ORDERED  TODAY     Follow-Up: At [facility], you and your health needs are our priority.  As part of our continuing mission to provide you with exceptional heart care, we have created designated Provider Care Teams.  These Care Teams include your primary Cardiologist (physician) and Advanced Practice Providers (APPs -  Physician Assistants and Nurse Practitioners) who all work together to provide you with the care you need, when you need it.  We recommend signing up for the patient portal called "MyChart".  Sign up information is provided on this After Visit Summary.  MyChart is used to connect with patients for Virtual Visits (Telemedicine).  Patients are able to view lab/test results, encounter notes, upcoming appointments, etc.  Non-urgent messages can be sent to your provider as well.   To learn more about what you can do with MyChart, go to [email or web address].    Your next appointment:   9 month(s)  The format for your next appointment:   In Person  Provider:    Dr. Paula Ross {   Other Instructions   Important Information About Sugar       

## 2022-08-26 ENCOUNTER — Emergency Department (HOSPITAL_COMMUNITY): Payer: Managed Care, Other (non HMO)

## 2022-08-26 ENCOUNTER — Other Ambulatory Visit: Payer: Self-pay

## 2022-08-26 ENCOUNTER — Encounter (HOSPITAL_COMMUNITY): Payer: Self-pay

## 2022-08-26 ENCOUNTER — Emergency Department (HOSPITAL_COMMUNITY)
Admission: EM | Admit: 2022-08-26 | Discharge: 2022-08-26 | Disposition: A | Discharge status: Home / Self Care | Payer: Managed Care, Other (non HMO) | Attending: Emergency Medicine | Admitting: Emergency Medicine

## 2022-08-26 DIAGNOSIS — R079 Chest pain, unspecified: Secondary | ICD-10-CM | POA: Insufficient documentation

## 2022-08-26 DIAGNOSIS — R519 Headache, unspecified: Secondary | ICD-10-CM | POA: Diagnosis not present

## 2022-08-26 DIAGNOSIS — R202 Paresthesia of skin: Secondary | ICD-10-CM | POA: Insufficient documentation

## 2022-08-26 DIAGNOSIS — M542 Cervicalgia: Secondary | ICD-10-CM | POA: Diagnosis present

## 2022-08-26 LAB — BASIC METABOLIC PANEL
Anion gap: 10 (ref 5–15)
BUN: 10 mg/dL (ref 6–20)
CO2: 25 mmol/L (ref 22–32)
Calcium: 9.6 mg/dL (ref 8.9–10.3)
Chloride: 103 mmol/L (ref 98–111)
Creatinine, Ser: 1.05 mg/dL (ref 0.61–1.24)
GFR, Estimated: >60 mL/min (ref >60)
Glucose, Bld: 92 mg/dL (ref 70–99)
Potassium: 4 mmol/L (ref 3.5–5.1)
Sodium: 138 mmol/L (ref 135–145)

## 2022-08-26 LAB — CBC WITH DIFFERENTIAL/PLATELET
Abs Immature Granulocytes: 0.02 10*3/uL (ref 0.00–0.07)
Basophils Absolute: 0 10*3/uL (ref 0.0–0.1)
Basophils Relative: 1 %
Eosinophils Absolute: 0 10*3/uL (ref 0.0–0.5)
Eosinophils Relative: 0 %
HCT: 46.3 % (ref 39.0–52.0)
Hemoglobin: 15.7 g/dL (ref 13.0–17.0)
Immature Granulocytes: 0 %
Lymphocytes Relative: 25 %
Lymphs Abs: 1.3 10*3/uL (ref 0.7–4.0)
MCH: 30.8 pg (ref 26.0–34.0)
MCHC: 33.9 g/dL (ref 30.0–36.0)
MCV: 91 fL (ref 80.0–100.0)
Monocytes Absolute: 0.5 10*3/uL (ref 0.1–1.0)
Monocytes Relative: 10 %
Neutro Abs: 3.4 10*3/uL (ref 1.7–7.7)
Neutrophils Relative %: 64 %
Platelets: 184 10*3/uL (ref 150–400)
RBC: 5.09 MIL/uL (ref 4.22–5.81)
RDW: 11.9 % (ref 11.5–15.5)
WBC: 5.2 10*3/uL (ref 4.0–10.5)
nRBC: 0 % (ref 0.0–0.2)

## 2022-08-26 LAB — TROPONIN I (HIGH SENSITIVITY)
Troponin I (High Sensitivity): 2 ng/L (ref <18)
Troponin I (High Sensitivity): <2 ng/L (ref <18)

## 2022-08-26 MED ORDER — LIDOCAINE 5 % EX PTCH
1.0000 | MEDICATED_PATCH | CUTANEOUS | Status: DC
  Administered 2022-08-26: 1 via TRANSDERMAL
  Filled 2022-08-26: qty 1

## 2022-08-26 MED ORDER — METHOCARBAMOL 500 MG PO TABS
500.0000 mg | ORAL_TABLET | Freq: Once | ORAL | Status: AC
  Administered 2022-08-26: 500 mg via ORAL
  Filled 2022-08-26: qty 1

## 2022-08-26 MED ORDER — METHOCARBAMOL 500 MG PO TABS: 500.0000 mg | ORAL_TABLET | Freq: Four times a day (QID) | ORAL | 0 refills | Status: DC

## 2022-08-26 MED ORDER — HYDROCODONE-ACETAMINOPHEN 5-325 MG PO TABS
1.0000 | ORAL_TABLET | Freq: Once | ORAL | Status: AC
  Administered 2022-08-26: 1 via ORAL
  Filled 2022-08-26: qty 1

## 2022-08-26 MED ORDER — IBUPROFEN 800 MG PO TABS: 800.0000 mg | ORAL_TABLET | Freq: Three times a day (TID) | ORAL | 0 refills | Status: DC | PRN

## 2022-08-26 NOTE — ED Triage Notes (Addendum)
[place] [facility] [place] [facility] [place]. Pt has been experiencing neck tightness, pain and pressure to the neck to the left for about 3 days. Periodic tingling and numbness. No stroke like symptoms. Normal sensation on both arms. A&O X4. Pt feels sluggish mentally.

## 2022-08-26 NOTE — Discharge Instructions (Signed)
Follow up with your Physician for recheck  

## 2022-08-26 NOTE — ED Notes (Signed)
Patient verbalizes understanding of discharge instructions. Opportunity for questioning and answers were provided. Pt discharged from ED. 

## 2022-08-26 NOTE — ED Provider Notes (Signed)
[facility] EMERGENCY DEPARTMENT AT [facility] Provider Note   CSN: [id number] Arrival date & time: 08/26/22  1218     History  Chief Complaint  Patient presents with   Neck Pain    Randy Barnett is a 27 y.o. male with no significant past medical history who presents to the ED due to left-sided neck tightness x 3 days. He states he was driving to work this morning when he suddenly felt a pressure in his head and numbness in his left arm. 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 Once he went to stand up his legs felt like "jello" and EMS transported him to the ED. His neck pain has been worsening over the last 3 days and is described as "sharp" and "stabbing". He tried icing the area, but this did not improve his condition. He states the pain radiates through his head when he palpates the area and is rated 10/10. No fever. Denies IV drug use.   He also states he developed left-sided chest pain that started earlier today.  Describes it as a pressure sensation like "something is sitting on his chest".  Patient states he occasionally gets "zapping pain" throughout the left side of his chest.  Patient has been having intermittent chest pain since July of last year.  No history of blood clots.  Denies associated shortness of breath.  Notes he lifts heavy things at work daily. No cardiac history.   History obtained from patient and past medical records. No interpreter used during encounter.       Home Medications Prior to Admission medications   Medication Sig Start Date End Date Taking? Authorizing Provider  Calcium Carb-Cholecalciferol (CALCIUM PLUS VITAMIN D3 PO) Take 1,000 mg by mouth in the morning, at noon, and at bedtime.    [provider]  famotidine (PEPCID) 20 MG tablet Take 1 tablet (20 mg total) by mouth 2 (two) times daily. 09/11/21   Gailen Shelter, PA  Omega-3 Fatty Acids (OMEGA-3 FISH  OIL) 1200 MG CAPS Take by mouth in the morning, at noon, and at bedtime.    [provider]      Allergies    Patient has no known allergies.    Review of Systems   Review of Systems  Constitutional:  Negative for chills and fever.  Respiratory:  Negative for shortness of breath.   Cardiovascular:  Positive for chest pain. Negative for leg swelling.  Musculoskeletal:  Positive for neck pain.    Physical Exam Updated Vital Signs BP (!) 142/90 (BP Location: Left Arm)   Pulse 65   Temp 97.8 F (36.6 C) (Oral)   Resp 19   SpO2 99%  Physical Exam Vitals and nursing note reviewed.  Constitutional:      General: He is not in acute distress.    Appearance: He is not ill-appearing.  HENT:     Head: Normocephalic.  Eyes:     Pupils: Pupils are equal, round, and reactive to light.  Neck:     Comments: No cervical midline tenderness.  Slight reproducible tenderness to left side of neck throughout musculature.  Full range of motion.  Equal grip strength. Cardiovascular:     Rate and Rhythm: Normal rate and regular rhythm.     Pulses: Normal pulses.     Heart sounds: Normal heart sounds. No murmur heard.    No friction rub. No gallop.  Pulmonary:  Effort: Pulmonary effort is normal.     Breath sounds: Normal breath sounds.  Abdominal:     General: Abdomen is flat. There is no distension.     Palpations: Abdomen is soft.     Tenderness: There is no abdominal tenderness. There is no guarding or rebound.  Musculoskeletal:        General: Normal range of motion.     Cervical back: Neck supple.     Comments: No lower extremity edema  Skin:    General: Skin is warm and dry.  Neurological:     General: No focal deficit present.     Mental Status: He is alert.  Psychiatric:        Mood and Affect: Mood normal.        Behavior: Behavior normal.     ED Results / Procedures / Treatments   Labs (all labs ordered are listed, but only abnormal results are  displayed) Labs Reviewed  CBC WITH DIFFERENTIAL/PLATELET  BASIC METABOLIC PANEL  TROPONIN I (HIGH SENSITIVITY)  TROPONIN I (HIGH SENSITIVITY)    EKG None  Radiology DG Chest 2 View  Result Date: 08/26/2022 CLINICAL DATA:  Chest pain EXAM: CHEST - 2 VIEW COMPARISON:  CXR 09/11/21 FINDINGS: No pleural effusion. No pneumothorax. Normal cardiac and mediastinal contours. No focal airspace opacity. No radiographically apparent displaced fracture. Vertebral body heights are maintained. IMPRESSION: No focal airspace opacity. Electronically Signed   By: Lorenza Cambridge M.D.   On: 08/26/2022 14:30    Procedures Procedures    Medications Ordered in ED Medications  lidocaine (LIDODERM) 5 % 1 patch (1 patch Transdermal Patch Applied 08/26/22 1353)  HYDROcodone-acetaminophen (NORCO/VICODIN) 5-325 MG per tablet 1 tablet (1 tablet Oral Given 08/26/22 1356)  methocarbamol (ROBAXIN) tablet 500 mg (500 mg Oral Given by Other 08/26/22 1355)    ED Course/ Medical Decision Making/ A&P                             Medical Decision Making Amount and/or Complexity of Data Reviewed Labs: ordered. Decision-making details documented in ED Course. Radiology: ordered and independent interpretation performed. Decision-making details documented in ED Course.  Risk Prescription drug management.   This patient presents to the ED for concern of neck pain/chest pain, this involves an extensive number of treatment options, and is a complaint that carries with it a high risk of complications and morbidity.  The differential diagnosis includes MSK etiology, central cord compression, infection, ACS, PE, aortic dissection, etc  27 year old male presents to the ED due to left-sided neck pain x 3 days.  Admits to occasional numbness/tingling to left arm.  Also endorses some left-sided chest pain.  No fever or chills.  No headache.  Denies IV drug use. No injury to neck. Upon arrival, stable vitals.  Patient in no acute  distress.  Physical exam significant for reproducible left-sided tenderness throughout musculature of neck.  No midline tenderness.  Equal grip strength.  Low suspicion for central cord compression.  No reproducible tenderness to left side of anterior chest wall.  No lower extremity edema.  Patient states chest pain has been occurring intermittently for over a year.  Cardiac labs ordered however, chest pain appears atypical in nature.  Lower suspicion for ACS.  PERC negative and low risk using Wells criteria.  Low suspicion for PE/DVT.  Suspect neck pain related to muscular etiology. Possible nerve impingement with occasionally numbness/tingling. No infectious symptoms to suggest infectious  etiology. Lidoderm patch, hydrocodone, and Robaxin given.  Chest x-ray personally reviewed and interpreted are negative for signs of pneumonia, pneumothorax, widened mediastinum.  EKG normal sinus rhythm.  No signs of acute ischemia.  No STEMI.  CBC unremarkable.  No leukocytosis.  Normal hemoglobin.  Patient handed off to Langston Masker, PA-C at shift change pending labs and symptomatic improvement.       Final Clinical Impression(s) / ED Diagnoses Final diagnoses:  Neck pain  Nonspecific chest pain    Rx / DC Orders ED Discharge Orders     None         Jesusita Oka 08/26/22 1530    Margarita Grizzle, MD 08/27/22 [phone]

## 2022-08-26 NOTE — ED Provider Notes (Signed)
Patient's care assumed at 3:30 PM from [facility] a [employer].  Patient is awaiting laboratory evaluation.  Patient has had a episode of chest pain and pain that went down his left arm.  His laboratory evaluations returned.  Patient counseled on results.  Patient is advised to follow-up with his primary care physician.   Osie Cheeks 08/26/22 Johny Shock, MD 08/27/22 [phone]

## 2022-08-26 NOTE — ED Notes (Signed)
20 ga IV removed from LAC; catheter intact; site clean, dry

## 2023-02-04 IMAGING — CR DG CHEST 2V
2 series · 2 of 2 positions shown · non-contrast
Comparison: Chest two views 05/08/2019

CLINICAL DATA: Chest pain and shortness of breath.

EXAM:
CHEST - 2 VIEW

[w chest pa]
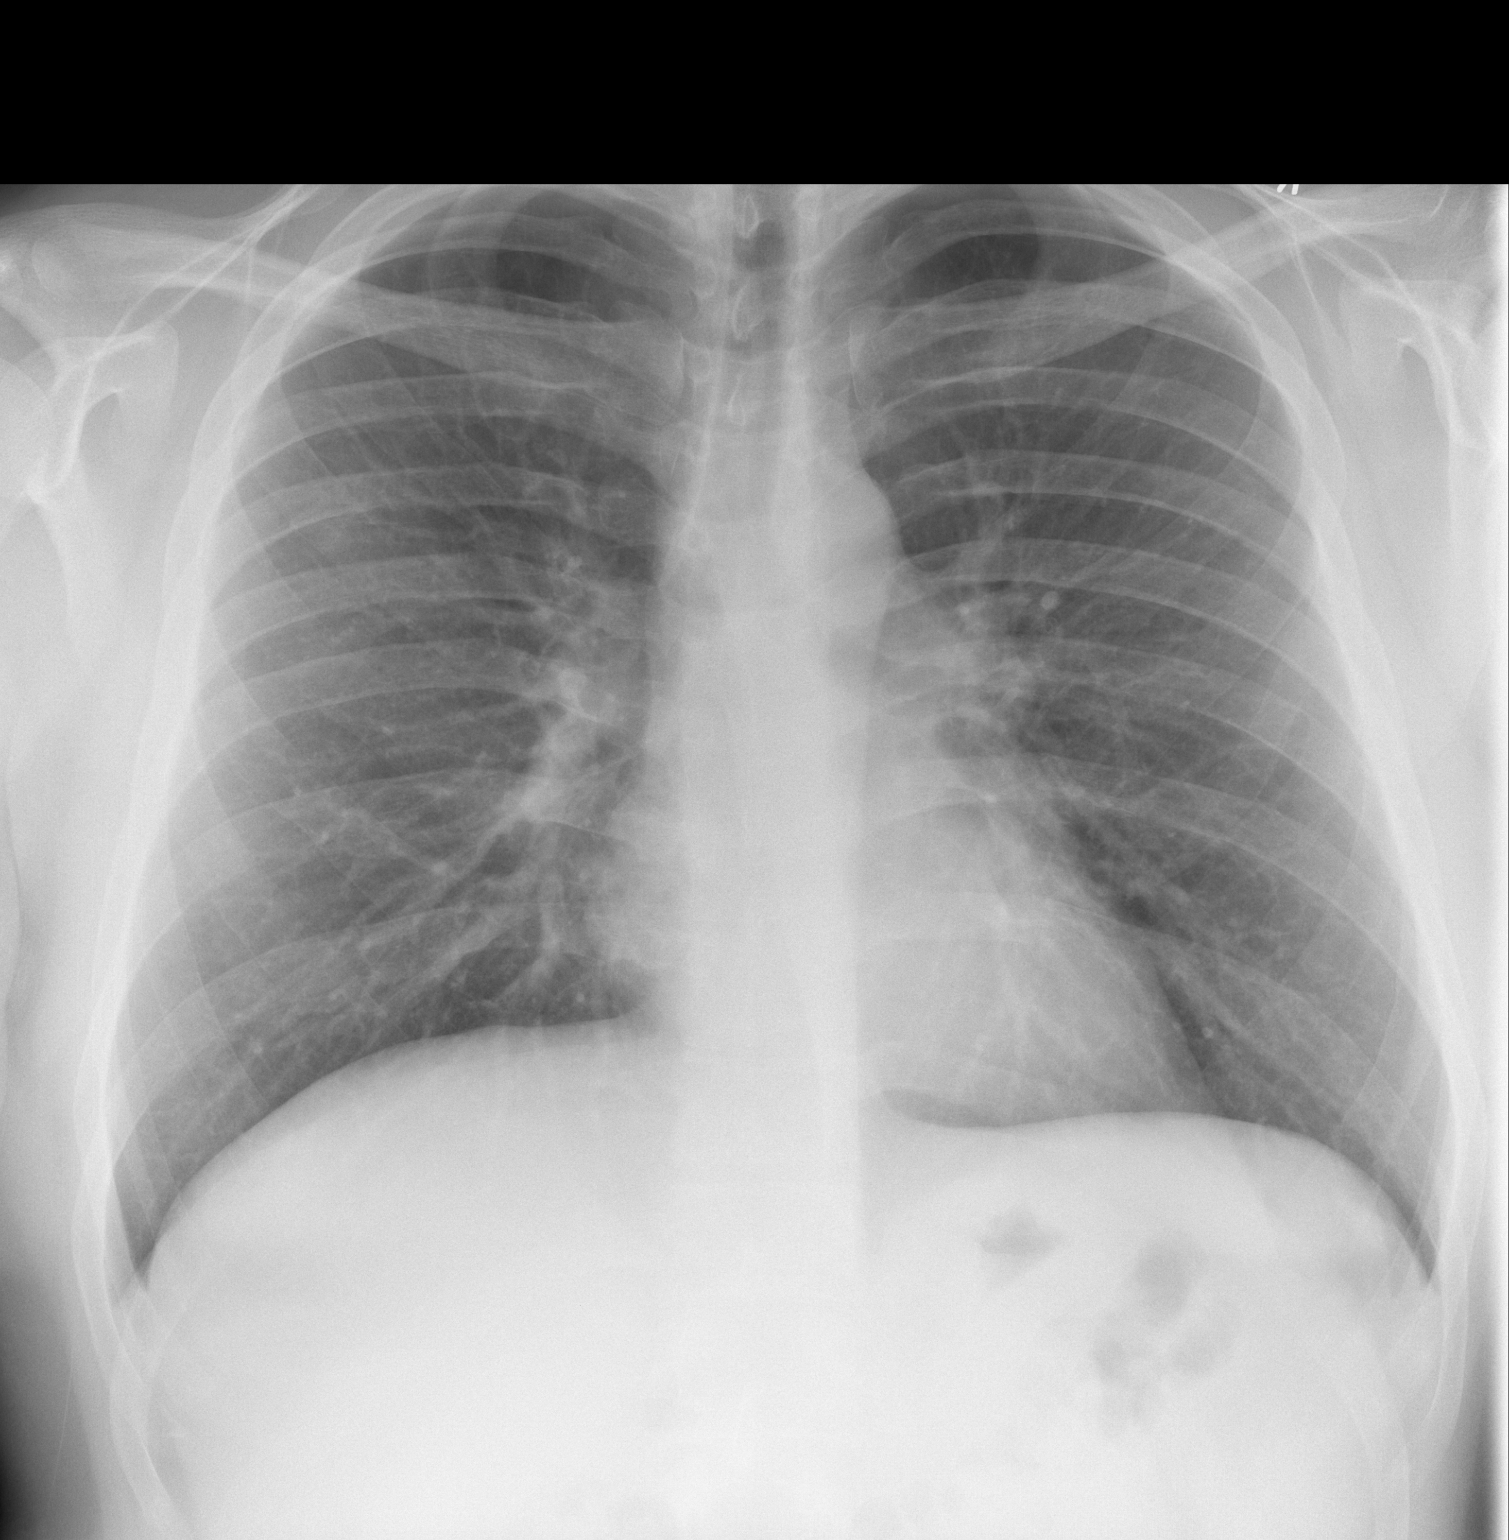

[w chest lat]
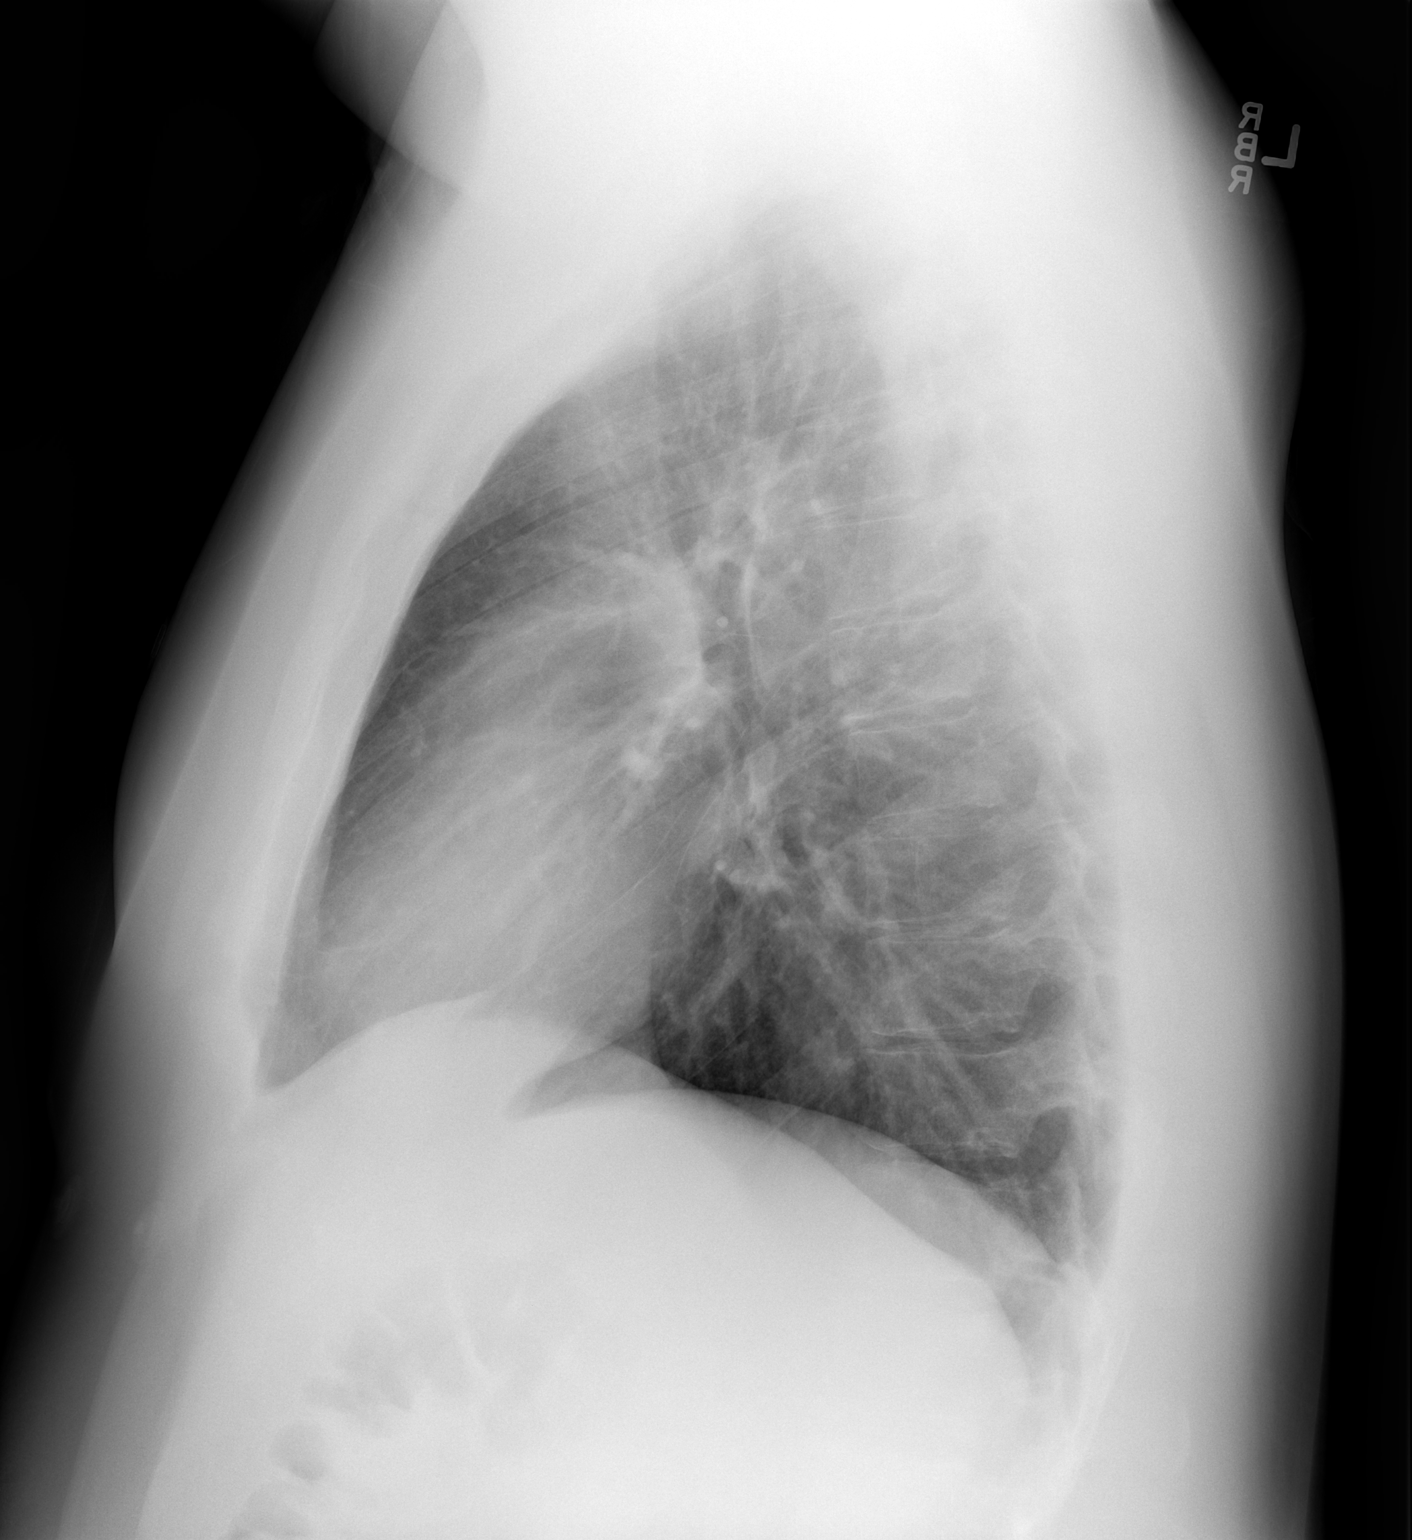

[2 of 2 positions shown; findings below may reference images not displayed]

FINDINGS: Cardiac silhouette and mediastinal contours are within normal
limits. The lungs are clear. No pleural effusion or pneumothorax. No
acute skeletal abnormality.
IMPRESSION: No active cardiopulmonary disease.

## 2023-04-01 ENCOUNTER — Emergency Department (HOSPITAL_COMMUNITY): Payer: Managed Care, Other (non HMO)

## 2023-04-01 ENCOUNTER — Emergency Department (HOSPITAL_COMMUNITY)
Admission: EM | Admit: 2023-04-01 | Discharge: 2023-04-02 | Disposition: A | Discharge status: Home / Self Care | Payer: Managed Care, Other (non HMO) | Attending: Emergency Medicine | Admitting: Emergency Medicine

## 2023-04-01 ENCOUNTER — Ambulatory Visit (HOSPITAL_COMMUNITY)
Admission: EM | Admit: 2023-04-01 | Discharge: 2023-04-01 | Disposition: A | Discharge status: Home / Self Care | Payer: Managed Care, Other (non HMO)

## 2023-04-01 ENCOUNTER — Encounter (HOSPITAL_COMMUNITY): Payer: Self-pay | Admitting: Emergency Medicine

## 2023-04-01 ENCOUNTER — Other Ambulatory Visit: Payer: Self-pay

## 2023-04-01 DIAGNOSIS — R6884 Jaw pain: Secondary | ICD-10-CM | POA: Insufficient documentation

## 2023-04-01 DIAGNOSIS — R519 Headache, unspecified: Secondary | ICD-10-CM

## 2023-04-01 DIAGNOSIS — M542 Cervicalgia: Secondary | ICD-10-CM | POA: Insufficient documentation

## 2023-04-01 DIAGNOSIS — J029 Acute pharyngitis, unspecified: Secondary | ICD-10-CM

## 2023-04-01 DIAGNOSIS — F17210 Nicotine dependence, cigarettes, uncomplicated: Secondary | ICD-10-CM | POA: Diagnosis not present

## 2023-04-01 LAB — BASIC METABOLIC PANEL
Anion gap: 10 (ref 5–15)
BUN: 7 mg/dL (ref 6–20)
CO2: 25 mmol/L (ref 22–32)
Calcium: 9.3 mg/dL (ref 8.9–10.3)
Chloride: 102 mmol/L (ref 98–111)
Creatinine, Ser: 1.09 mg/dL (ref 0.61–1.24)
GFR, Estimated: >60 mL/min (ref >60)
Glucose, Bld: 100 mg/dL — ABNORMAL HIGH (ref 70–99)
Potassium: 3.8 mmol/L (ref 3.5–5.1)
Sodium: 137 mmol/L (ref 135–145)

## 2023-04-01 LAB — CBC
HCT: 47.4 % (ref 39.0–52.0)
Hemoglobin: 16.2 g/dL (ref 13.0–17.0)
MCH: 30.9 pg (ref 26.0–34.0)
MCHC: 34.2 g/dL (ref 30.0–36.0)
MCV: 90.5 fL (ref 80.0–100.0)
Platelets: 203 10*3/uL (ref 150–400)
RBC: 5.24 MIL/uL (ref 4.22–5.81)
RDW: 12.3 % (ref 11.5–15.5)
WBC: 8.3 10*3/uL (ref 4.0–10.5)
nRBC: 0 % (ref 0.0–0.2)

## 2023-04-01 MED ORDER — IOHEXOL 350 MG/ML SOLN
75.0000 mL | Freq: Once | INTRAVENOUS | Status: AC | PRN
  Administered 2023-04-01: 75 mL via INTRAVENOUS

## 2023-04-01 NOTE — ED Triage Notes (Signed)
 Pt present with complaints of head pressure for 1-2 months. Pt went to UC today and was advised to come to ER. Pt states pain is worse on left side of face. Pt gets intermittent episodes of vertigo. Also states bp has been elevated

## 2023-04-01 NOTE — Discharge Instructions (Addendum)
 Needs to have CT of head right away

## 2023-04-01 NOTE — ED Provider Triage Note (Signed)
 Emergency Medicine Provider Triage Evaluation Note  Randy Barnett , a 28 y.o. male  was evaluated in triage.  Pt complains of head pressure x 7 weeks.  Symptoms almost daily.  Patient frequently changing.  Seen at urgent care referred here today for imaging.  Reports intermittent blurry vision, floaters, vertigo-like episodes.  Review of Systems  Positive:  Negative:   Physical Exam  BP (!) 147/96 (BP Location: Right Arm)   Pulse 79   Temp 98.4 F (36.9 C)   Resp 16   SpO2 99%  Gen:   Awake, no distress   Resp:  Normal effort  MSK:   Moves extremities without difficulty  Other:  Neuro without focal deficit  Medical Decision Making  Medically screening exam initiated at 6:30 PM.  Appropriate orders placed.  Randy Barnett was informed that the remainder of the evaluation will be completed by another provider, this initial triage assessment does not replace that evaluation, and the importance of remaining in the ED until their evaluation is complete.     Randy Lynwood DEL, PA-C 04/01/23 1831

## 2023-04-01 NOTE — ED Notes (Signed)
 Patient is being discharged from the Urgent Care and sent to the Emergency Department via POV . Per Galen Stallion, PA, patient is in need of higher level of care due to need of head CT. Patient is aware and verbalizes understanding of plan of care.  Vitals:   04/01/23 1653  BP: (!) 142/87  Pulse: 85  Resp: 18  Temp: 97.7 F (36.5 C)  SpO2: 98%

## 2023-04-01 NOTE — ED Provider Notes (Signed)
 [facility]    CSN: [id number] Arrival date & time: 04/01/23  1405      History   Chief Complaint Chief Complaint  Patient presents with   Headache    HPI Randy Barnett is a 28 y.o. male who presents with hx of every day head pressure x 8 weeks. He denies headache. He has had  off balance sensation for a few seconds  and then resolves. Also noticed longer times of HA when he plays pool. Out of the 9 hours, his pressure lasts 4 h. Has noticed focusing provblems since the head pressure started. Has noticed has no discomfort when he lays down, or reclines and on lays on his side to watch TV Has been having L Tinnitus x 2 weeks  The head pressure May last from 1-4 hours. If he leans over feels pressure on his face and leaning back gives him relief.  His nose gets stuffy when he feels the head pressure.   He also mentioned he has a mild ST  today but admits of vaping a lot since yesterday.  Pressing on sides of  head helps when he feels the pressure in his head. Right now feels pressure on his L temple area and sometimes his face feels numb when he gets pressure on his face.  His mother has a brain anomaly He has not vomited.     Past Medical History:  Diagnosis Date   Abnormal EKG    Acute pharyngitis    Chest pain    Cough    Dizziness    Dysuria    Elevated blood pressure reading    Fatigue    Foot pain    Low back pain    Pain in left hip    Pruritus    Scabies    SOB (shortness of breath)    Strain of lumbar region    Tonsillitis    URI (upper respiratory infection)     There are no active problems to display for this patient.   History reviewed. No pertinent surgical history.     Home Medications    Prior to Admission medications   Medication Sig Start Date End Date Taking? Authorizing Provider  Calcium Carb-Cholecalciferol (CALCIUM PLUS VITAMIN D3 PO) Take 1,000 mg by mouth in the morning, at noon, and at bedtime.    [provider]   famotidine  (PEPCID ) 20 MG tablet Take 1 tablet (20 mg total) by mouth 2 (two) times daily. 09/11/21   Neldon Hamp RAMAN, PA  ibuprofen  (ADVIL ) 800 MG tablet Take 1 tablet (800 mg total) by mouth every 8 (eight) hours as needed. 08/26/22   Sofia, Leslie K, PA-C  methocarbamol  (ROBAXIN ) 500 MG tablet Take 1 tablet (500 mg total) by mouth 4 (four) times daily. 08/26/22   Flint Sonny POUR, PA-C  Omega-3 Fatty Acids (OMEGA-3 FISH OIL) 1200 MG CAPS Take by mouth in the morning, at noon, and at bedtime.    [provider]    Family History Family History  Problem Relation Age of Onset   Diabetes Father     Social History Social History   Tobacco Use   Smoking status: Every Day    Current packs/day: 1.00    Types: Cigarettes   Smokeless tobacco: Current    Types: Snuff, Chew  Vaping Use   Vaping status: Former  Substance Use Topics   Alcohol use: No    Comment: previously 1-1.5 cases per week   Drug use: No  Allergies   Patient has no known allergies.   Review of Systems Review of Systems As noted in HPI  Physical Exam Triage Vital Signs ED Triage Vitals [04/01/23 1653]  Encounter Vitals Group     BP (!) 142/87     Systolic BP Percentile      Diastolic BP Percentile      Pulse Rate 85     Resp 18     Temp 97.7 F (36.5 C)     Temp Source Oral     SpO2 98 %     Weight      Height      Head Circumference      Peak Flow      Pain Score 0     Pain Loc      Pain Education      Exclude from Growth Chart    No data found.  Updated Vital Signs BP (!) 142/87 (BP Location: Right Arm)   Pulse 85   Temp 97.7 F (36.5 C) (Oral)   Resp 18   SpO2 98%   Visual Acuity Right Eye Distance:   Left Eye Distance:   Bilateral Distance:    Right Eye Near:   Left Eye Near:    Bilateral Near:     Physical Exam  Physical Exam Vitals signs and nursing note reviewed.  Constitutional:      General: He is not in acute distress.    Appearance: He is  well-developed and normal weight. He is not ill-appearing, toxic-appearing or diaphoretic.  HENT: There is no face asymmetry    Head: Normocephalic. Sinuses are not tender to palpation. Pharynx looks a little irritated.  Eyes:     Extraocular Movements: Extraocular movements intact.     Pupils: Pupils are equal, round, and reactive to light.  Neck:     Musculoskeletal: Neck supple. No neck rigidity.     Has tender L sternocleidomastoid muscle Cardiovascular:     Rate and Rhythm: Normal rate and regular rhythm.     Heart sounds: No murmur.  Pulmonary:     Effort: Pulmonary effort is normal.     Breath sounds: Normal breath sounds. No wheezing, rhonchi or rales.  Abdominal:     General: Bowel sounds are normal.     Palpations: Abdomen is soft. There is no mass.     Tenderness: There is no abdominal tenderness. There is no guarding.  Musculoskeletal: Normal range of motion.  Lymphadenopathy:     Cervical: No cervical adenopathy.  Skin:    General: Skin is warm and dry.  Neurological:     Mental Status: He is alert.     Cranial Nerves: No cranial nerve deficit or facial asymmetry.     Sensory: No sensory deficit.     Motor: No weakness.     Coordination: Romberg sign negative. Coordination normal.     Gait: Gait normal.     Deep Tendon Reflexes: Reflexes normal.     Comments: Normal Romberg, propioception, finger to nose, tandem gait.   Psychiatric:        Mood and Affect: Mood normal.        Speech: Speech normal.        Behavior: Behavior normal.   UC Treatments / Results  Labs (all labs ordered are listed, but only abnormal results are displayed) Labs Reviewed - No data to display  EKG   Radiology No results found.  Procedures Procedures (including critical care time)  Medications Ordered  in UC Medications - No data to display  Initial Impression / Assessment and Plan / UC Course  I have reviewed the triage vital signs and the nursing notes.  Persistent head  pressure with vision changes  Sent to ER for work up  Final Clinical Impressions(s) / UC Diagnoses   Final diagnoses:  Pressure in head  Acute pharyngitis, unspecified etiology     Discharge Instructions      Needs to have CT of head right away      ED Prescriptions   None    PDMP not reviewed this encounter.   Lindi Carter, PA-C 04/01/23 1851

## 2023-04-01 NOTE — ED Triage Notes (Addendum)
 Pt states that he is having  persistent on and off HA every day for the past 8 weeks, vertigo and blurry vision. Pt also c/o mild sore throat after triage was completed.

## 2023-04-02 ENCOUNTER — Encounter: Payer: Self-pay | Admitting: Neurology

## 2023-04-02 MED ORDER — TIZANIDINE HCL 2 MG PO TABS: 1.0000 mg | ORAL_TABLET | Freq: Three times a day (TID) | ORAL | 0 refills | Status: DC | PRN

## 2023-04-02 NOTE — ED Provider Notes (Signed)
 Emergency Department Provider Note  TRIAGE NOTE: Pt present with complaints of head pressure for 1-2 months. Pt went to UC today and was advised to come to ER. Pt states pain is worse on left side of face. Pt gets intermittent episodes of vertigo. Also states bp has been elevated   HISTORY  Chief Complaint Headache   HPI Randy Barnett is a 28 y.o. male with no significant past medical history presents the ER from urgent care secondary to headaches.  Patient states that he started having self-described panic attacks a couple years ago.  Have been relatively well-controlled over the last year but then the last 6 to 8 weeks has had increasing worry, stress from work and increased pressure in his head.  Seems to worsen his left temple area than his right.  He has tried multiple nonpharmacologic treatments without much help.  States that he talked to a pharmacist a couple weeks ago which thought it might be his blood pressure but suggested trying some decongestant along with Tylenol  and that seemed to help but only for an hour or 2.  Patient states he is gained about 10 to 15 pounds after losing 70.  He also states his significant increase in his caffeine intake.  States he has a lot of stress from his job, money is tight.  He also plays pool he has noticed that sometimes the headaches happen when he is playing pool and then he will stand up and start get lightheaded and vertiginous.  No neurologic changes otherwise.  Minimal alcohol use.  No illicit drug use.  No other associated symptoms.  PMH Past Medical History:  Diagnosis Date   Abnormal EKG    Acute pharyngitis    Chest pain    Cough    Dizziness    Dysuria    Elevated blood pressure reading    Fatigue    Foot pain    Low back pain    Pain in left hip    Pruritus    Scabies    SOB (shortness of breath)    Strain of lumbar region    Tonsillitis    URI (upper respiratory infection)     Home Medications Prior to Admission  medications   Medication Sig Start Date End Date Taking? Authorizing Provider  tiZANidine  (ZANAFLEX ) 2 MG tablet Take 0.5-1 tablets (1-2 mg total) by mouth every 8 (eight) hours as needed for muscle spasms. 04/02/23  Yes Koichi Platte, Selinda, MD  Calcium Carb-Cholecalciferol (CALCIUM PLUS VITAMIN D3 PO) Take 1,000 mg by mouth in the morning, at noon, and at bedtime.    [provider]  famotidine  (PEPCID ) 20 MG tablet Take 1 tablet (20 mg total) by mouth 2 (two) times daily. 09/11/21   Neldon Hamp RAMAN, PA  ibuprofen  (ADVIL ) 800 MG tablet Take 1 tablet (800 mg total) by mouth every 8 (eight) hours as needed. 08/26/22   Sofia, Leslie K, PA-C  methocarbamol  (ROBAXIN ) 500 MG tablet Take 1 tablet (500 mg total) by mouth 4 (four) times daily. 08/26/22   Flint Sonny POUR, PA-C  Omega-3 Fatty Acids (OMEGA-3 FISH OIL) 1200 MG CAPS Take by mouth in the morning, at noon, and at bedtime.    [provider]    Social History Social History   Tobacco Use   Smoking status: Every Day    Current packs/day: 1.00    Types: Cigarettes   Smokeless tobacco: Current    Types: Snuff, Chew  Vaping Use   Vaping  status: Former  Substance Use Topics   Alcohol use: No    Comment: previously 1-1.5 cases per week   Drug use: No    Review of Systems: Documented in HPI ____________________________________________  PHYSICAL EXAM: VITAL SIGNS: ED Triage Vitals  Encounter Vitals Group     BP 04/01/23 1810 (!) 147/96     Systolic BP Percentile --      Diastolic BP Percentile --      Pulse Rate 04/01/23 1810 79     Resp 04/01/23 1810 16     Temp 04/01/23 1810 98.4 F (36.9 C)     Temp Source 04/01/23 2220 Oral     SpO2 04/01/23 1810 99 %   Physical Exam Vitals and nursing note reviewed.  Constitutional:      Appearance: He is well-developed.  HENT:     Head: Normocephalic and atraumatic.  Eyes:     Extraocular Movements: Extraocular movements intact.  Cardiovascular:     Rate and Rhythm: Normal  rate.  Pulmonary:     Effort: Pulmonary effort is normal. No respiratory distress.  Abdominal:     General: There is no distension.  Musculoskeletal:        General: Normal range of motion.     Cervical back: Normal range of motion.  Neurological:     Mental Status: He is alert. Mental status is at baseline.     GCS: GCS eye subscore is 4. GCS verbal subscore is 5. GCS motor subscore is 6.     Cranial Nerves: No cranial nerve deficit.       ____________________________________________   LABS (all labs ordered are listed, but only abnormal results are displayed)  Labs Reviewed  BASIC METABOLIC PANEL - Abnormal; Notable for the following components:      Result Value   Glucose, Bld 100 (*)    All other components within normal limits  CBC   ____________________________________________  EKG   ____________________________________________  RADIOLOGY  CT ANGIO HEAD NECK W WO CM Result Date: 04/02/2023 CLINICAL DATA:  Carotid artery aneurysm suspected Vertebral artery aneurysm suspected. Patient complains of head pressure. EXAM: CT ANGIOGRAPHY HEAD AND NECK WITH AND WITHOUT CONTRAST TECHNIQUE: Multidetector CT imaging of the head and neck was performed using the standard protocol during bolus administration of intravenous contrast. Multiplanar CT image reconstructions and MIPs were obtained to evaluate the vascular anatomy. Carotid stenosis measurements (when applicable) are obtained utilizing NASCET criteria, using the distal internal carotid diameter as the denominator. RADIATION DOSE REDUCTION: This exam was performed according to the departmental dose-optimization program which includes automated exposure control, adjustment of the mA and/or kV according to patient size and/or use of iterative reconstruction technique. CONTRAST:  75mL OMNIPAQUE  IOHEXOL  350 MG/ML SOLN COMPARISON:  None Available. FINDINGS: CT HEAD FINDINGS Brain: No evidence of acute infarction, hemorrhage,  hydrocephalus, extra-axial collection or mass lesion/mass effect. Vascular: See below. Skull: No acute fracture. Sinuses/Orbits: Clear sinuses.  No acute orbital findings. Other: No mastoid effusions. Review of the MIP images confirms the above findings CTA NECK FINDINGS Aortic arch: Great vessel origins are patent without significant stenosis. Right carotid system: No evidence of dissection, stenosis (50% or greater), or occlusion. Left carotid system: No evidence of dissection, stenosis (50% or greater), or occlusion. Vertebral arteries: Right dominant. No evidence of dissection, stenosis (50% or greater), or occlusion. Skeleton: No acute abnormality on limited assessment. Other neck: No acute abnormality on limited assessment. Upper chest: Visualized lung apices are clear Review of the MIP images  confirms the above findings CTA HEAD FINDINGS Anterior circulation: Bilateral intracranial ICAs, MCAs, and ACAs are patent without proximal hemodynamically significant stenosis. Posterior circulation: Bilateral intradural vertebral arteries, basilar artery, and bilateral posterior cerebral arteries are patent without proximal hemodynamically significant stenosis. Small right P1 PCA with prominent right posterior communicating artery, anatomic variant. Venous sinuses: As permitted by contrast timing, patent. Review of the MIP images confirms the above findings IMPRESSION: No emergent large vessel occlusion or proximal hemodynamically significant stenosis. Electronically Signed   By: Gilmore GORMAN Molt M.D.   On: 04/02/2023 01:22   ____________________________________________  PROCEDURES  Procedure(s) performed:   Procedures ____________________________________________  INITIAL IMPRESSION / ASSESSMENT AND PLAN   This patient presents to the ED for concern of headache, this involves an extensive number of treatment options, and is a complaint that carries with it a high risk of complications and morbidity.   Based on exam, history of present illness I think the most likely etiology is tension headache, however am considering aneurysm, migraine, menierres as well but thought to be less likely based on H&P and workup to this point.   Additional history obtained:  Additional history obtained from patient Previous records obtained and reviewed UC visit earlier  Co morbidities that complicate the patient evaluation  N/a  Social Determinants of Health:  Stress Not satisfied with work.   Initial Plan:  Workup completed in triage. CTA head/neck reassuring for aneurysm, space occupying lesion, CVA. Spoke at length with him about pharmacologic and non-pharmacologic treatments and neuro/mental health follow ups. Will try ibu/tyl and tizanidine  PRN for now. If not improving will seek neuro follow up. Will return to ed for new/worsening symptoms.   ED Course  Images ordered viewed and obtained by myself. Agree with Radiology interpretation. Details in ED course.  Labs ordered reviewed by myself as detailed in ED course.  Consultations obtained/considered detailed in ED course.        Cardiac Monitoring:  N/a  CRITICAL INTERVENTIONS:  N/a  Reevaluation:  After the interventions noted above, I reevaluated the patient and found that they have :stayed the same  FINAL IMPRESSION AND PLAN Final diagnoses:  Nonintractable episodic headache, unspecified headache type  Neck pain  Jaw pain   A medical screening exam was performed and I feel the patient has had an appropriate workup for their chief complaint at this time and likelihood of emergent condition existing is low. They have been counseled on decision, DISCHARGE, follow up and which symptoms necessitate immediate return to the emergency department. They or their family verbally stated understanding and agreement with plan and discharged in stable condition.   ____________________________________________   NEW OUTPATIENT MEDICATIONS  STARTED DURING THIS VISIT:  New Prescriptions   TIZANIDINE  (ZANAFLEX ) 2 MG TABLET    Take 0.5-1 tablets (1-2 mg total) by mouth every 8 (eight) hours as needed for muscle spasms.    Note:  This note was prepared with assistance of Dragon voice recognition software. Occasional wrong-word or sound-a-like substitutions may have occurred due to the inherent limitations of voice recognition software.    Rinda Rollyson, Selinda, MD 04/02/23 [id number]

## 2023-08-04 NOTE — Progress Notes (Deleted)
 NEUROLOGY CONSULTATION NOTE  Burnett Sistare MRN: [id number] DOB: 19-Feb-1996  Referring provider: Eve Hinders, MD (ED referral) Primary care provider: ***  Reason for consult:  headache  Assessment/Plan:   ***   Subjective:  Randy Barnett is a 28 year old ***-handed male who presents for headache.  History supplemented by ED note.  CTA head and neck personally reviewed.  He has history of panic attacks.  In November-December 2024, he began experiencing increased anxiety  related to financial and work-related stress.  He started experiencing headaches, described as left greater than right temporal pressure.  No associated ***.  They last *** and occur ***.  They tend to occur when he is playing pool.  If he stands up too quick, he may get lightheaded.  He tried *** and decongestants which were ineffective.  Triggers ***  He went to the ED on 04/01/2023.  Blood pressure was initially elevated at 147/96.  CT head and CTA head and neck were unremarkable. Aaron Aas  He was discharged on tizanidine .  ***    Current NSAIDs/analgesics:  ***  Past NSAIDs/analgesics:  diclofenac , naproxen  Past muscle relaxants:  tizanidine , methocarbamol   PAST MEDICAL HISTORY: Past Medical History:  Diagnosis Date   Abnormal EKG    Acute pharyngitis    Chest pain    Cough    Dizziness    Dysuria    Elevated blood pressure reading    Fatigue    Foot pain    Low back pain    Pain in left hip    Pruritus    Scabies    SOB (shortness of breath)    Strain of lumbar region    Tonsillitis    URI (upper respiratory infection)     PAST SURGICAL HISTORY: No past surgical history on file.  MEDICATIONS: Current Outpatient Medications on File Prior to Visit  Medication Sig Dispense Refill   Calcium Carb-Cholecalciferol (CALCIUM PLUS VITAMIN D3 PO) Take 1,000 mg by mouth in the morning, at noon, and at bedtime.     famotidine  (PEPCID ) 20 MG tablet Take 1 tablet (20 mg total) by mouth 2 (two) times  daily. 30 tablet 0   ibuprofen  (ADVIL ) 800 MG tablet Take 1 tablet (800 mg total) by mouth every 8 (eight) hours as needed. 30 tablet 0   methocarbamol  (ROBAXIN ) 500 MG tablet Take 1 tablet (500 mg total) by mouth 4 (four) times daily. 20 tablet 0   Omega-3 Fatty Acids (OMEGA-3 FISH OIL) 1200 MG CAPS Take by mouth in the morning, at noon, and at bedtime.     tiZANidine  (ZANAFLEX ) 2 MG tablet Take 0.5-1 tablets (1-2 mg total) by mouth every 8 (eight) hours as needed for muscle spasms. 15 tablet 0   No current facility-administered medications on file prior to visit.    ALLERGIES: No Known Allergies  FAMILY HISTORY: Family History  Problem Relation Age of Onset   Diabetes Father     Objective:  *** General: No acute distress.  Patient appears well-groomed.   Head:  Normocephalic/atraumatic Eyes:  fundi examined but not visualized Neck: supple, no paraspinal tenderness, full range of motion Back: No paraspinal tenderness Heart: regular rate and rhythm Lungs: Clear to auscultation bilaterally. Vascular: No carotid bruits. Neurological Exam: Mental status: alert and oriented to person, place, and time, speech fluent and not dysarthric, language intact. Cranial nerves: CN I: not tested CN II: pupils equal, round and reactive to light, visual fields intact CN III, IV, VI:  full range of  motion, no nystagmus, no ptosis CN V: facial sensation intact. CN VII: upper and lower face symmetric CN VIII: hearing intact CN IX, X: gag intact, uvula midline CN XI: sternocleidomastoid and trapezius muscles intact CN XII: tongue midline Bulk & Tone: normal, no fasciculations. Motor:  muscle strength 5/5 throughout Sensation:  Pinprick, temperature and vibratory sensation intact. Deep Tendon Reflexes:  2+ throughout,  toes downgoing.   Finger to nose testing:  Without dysmetria.   Heel to shin:  Without dysmetria.   Gait:  Normal station and stride.  Romberg negative.    Thank you for  allowing me to take part in the care of this patient.  Janne Members, DO  CC: ***

## 2023-08-05 ENCOUNTER — Encounter: Payer: Self-pay | Admitting: Neurology

## 2023-08-05 ENCOUNTER — Ambulatory Visit: Payer: Managed Care, Other (non HMO) | Admitting: Neurology

## 2023-09-11 ENCOUNTER — Encounter (HOSPITAL_COMMUNITY): Payer: Self-pay | Admitting: Emergency Medicine

## 2023-09-11 ENCOUNTER — Emergency Department (HOSPITAL_COMMUNITY)
Admission: EM | Admit: 2023-09-11 | Discharge: 2023-09-11 | Disposition: A | Discharge status: Home / Self Care | Attending: Emergency Medicine | Admitting: Emergency Medicine

## 2023-09-11 DIAGNOSIS — F419 Anxiety disorder, unspecified: Secondary | ICD-10-CM | POA: Diagnosis not present

## 2023-09-11 DIAGNOSIS — R202 Paresthesia of skin: Secondary | ICD-10-CM | POA: Diagnosis not present

## 2023-09-11 DIAGNOSIS — M542 Cervicalgia: Secondary | ICD-10-CM | POA: Insufficient documentation

## 2023-09-11 DIAGNOSIS — R079 Chest pain, unspecified: Secondary | ICD-10-CM

## 2023-09-11 DIAGNOSIS — Z79899 Other long term (current) drug therapy: Secondary | ICD-10-CM | POA: Diagnosis not present

## 2023-09-11 DIAGNOSIS — R7309 Other abnormal glucose: Secondary | ICD-10-CM | POA: Insufficient documentation

## 2023-09-11 DIAGNOSIS — R739 Hyperglycemia, unspecified: Secondary | ICD-10-CM

## 2023-09-11 LAB — CBC WITH DIFFERENTIAL/PLATELET
Abs Immature Granulocytes: 0.02 10*3/uL (ref 0.00–0.07)
Basophils Absolute: 0 10*3/uL (ref 0.0–0.1)
Basophils Relative: 1 %
Eosinophils Absolute: 0 10*3/uL (ref 0.0–0.5)
Eosinophils Relative: 0 %
HCT: 46.4 % (ref 39.0–52.0)
Hemoglobin: 15.4 g/dL (ref 13.0–17.0)
Immature Granulocytes: 0 %
Lymphocytes Relative: 26 %
Lymphs Abs: 1.6 10*3/uL (ref 0.7–4.0)
MCH: 30.2 pg (ref 26.0–34.0)
MCHC: 33.2 g/dL (ref 30.0–36.0)
MCV: 91 fL (ref 80.0–100.0)
Monocytes Absolute: 0.6 10*3/uL (ref 0.1–1.0)
Monocytes Relative: 9 %
Neutro Abs: 4.1 10*3/uL (ref 1.7–7.7)
Neutrophils Relative %: 64 %
Platelets: 180 10*3/uL (ref 150–400)
RBC: 5.1 MIL/uL (ref 4.22–5.81)
RDW: 12.1 % (ref 11.5–15.5)
WBC: 6.3 10*3/uL (ref 4.0–10.5)
nRBC: 0 % (ref 0.0–0.2)

## 2023-09-11 LAB — BASIC METABOLIC PANEL WITH GFR
Anion gap: 9 (ref 5–15)
BUN: 11 mg/dL (ref 6–20)
CO2: 26 mmol/L (ref 22–32)
Calcium: 9.2 mg/dL (ref 8.9–10.3)
Chloride: 103 mmol/L (ref 98–111)
Creatinine, Ser: 1.14 mg/dL (ref 0.61–1.24)
GFR, Estimated: >60 mL/min (ref >60)
Glucose, Bld: 104 mg/dL — ABNORMAL HIGH (ref 70–99)
Potassium: 3.9 mmol/L (ref 3.5–5.1)
Sodium: 138 mmol/L (ref 135–145)

## 2023-09-11 LAB — TROPONIN I (HIGH SENSITIVITY)
Troponin I (High Sensitivity): <2 ng/L (ref <18)
Troponin I (High Sensitivity): <2 ng/L (ref <18)

## 2023-09-11 MED ORDER — NITROGLYCERIN 0.4 MG SL SUBL
0.4000 mg | SUBLINGUAL_TABLET | SUBLINGUAL | Status: AC | PRN
  Administered 2023-09-11 (×3): 0.4 mg via SUBLINGUAL
  Filled 2023-09-11 (×3): qty 1

## 2023-09-11 MED ORDER — HYDROXYZINE HCL 25 MG PO TABS
25.0000 mg | ORAL_TABLET | Freq: Once | ORAL | Status: AC
  Administered 2023-09-11: 25 mg via ORAL
  Filled 2023-09-11: qty 1

## 2023-09-11 MED ORDER — ASPIRIN 81 MG PO CHEW
324.0000 mg | CHEWABLE_TABLET | Freq: Once | ORAL | Status: AC
  Administered 2023-09-11: 324 mg via ORAL
  Filled 2023-09-11: qty 4

## 2023-09-11 NOTE — ED Triage Notes (Addendum)
 Pt driving home from pool tournament and pulled over at [employer]. He states he gets chills and tingling and numbness sweaty and chest tightness- told anxiety. CT scan last visit was clear.  Chronic neck and back issues and sees neurology- CTs and MRIs are clear. States he feels static/ tingling feeling in head now. PT appears anxious and is tearful at times stating he just wants an answer for these episodes

## 2023-09-11 NOTE — Discharge Instructions (Addendum)
 Your evaluation today did not show any signs of any serious neurologic injury and no signs of a heart attack.  For your neck pain, please try applying ice.  Ice can be applied for 30 minutes at a time, 4 times a day.  You may take ibuprofen  or naproxen  as needed for pain.  To get additional pain relief, you can add acetaminophen .  When you combine acetaminophen  with either ibuprofen  or naproxen , you get better pain relief than you get from taking either medication by itself.  If you are having concerns about your ongoing nasal congestion, I recommend that you follow-up with the ENT physician for further evaluation.  Return to the emergency department for any new or concerning symptoms.

## 2023-09-11 NOTE — ED Provider Notes (Signed)
 [place] EMERGENCY DEPARTMENT AT [facility] Provider Note   CSN: [id number] Arrival date & time: 09/11/23  0010     Patient presents with: No chief complaint on file.   Randy Barnett is a 28 y.o. male.   The history is provided by the patient.  He has been having problems with pain in his neck going down to his right pinky finger for several months and has been evaluated by neurologist.  2 days ago, he noted some tingling in the finger.  He was at a pool tournament and noted that the tingling seem to go over into his left arm and also some tingling of the scalp area.  He got concerned and was driving to the ED when he developed a heavy feeling in his chest with associated cold sweat and mild dyspnea.  There was no nausea or vomiting.  He still has slight heavy feeling in his chest.  He has been evaluated by neurologist and had MRIs of the brain and cervical spine which were negative.  He is wondering whether he just had a severe anxiety attack.  He does vape a lot including tonight.  He denies history of hypertension, diabetes, hyperlipidemia.  There is history of premature coronary atherosclerosis in first-degree relatives.   Prior to Admission medications   Medication Sig Start Date End Date Taking? Authorizing Provider  Calcium Carb-Cholecalciferol (CALCIUM PLUS VITAMIN D3 PO) Take 1,000 mg by mouth in the morning, at noon, and at bedtime.    [provider]  famotidine  (PEPCID ) 20 MG tablet Take 1 tablet (20 mg total) by mouth 2 (two) times daily. 09/11/21   Randy Dexter, PA  ibuprofen  (ADVIL ) 800 MG tablet Take 1 tablet (800 mg total) by mouth every 8 (eight) hours as needed. 08/26/22   Randy Crosby, PA-C  methocarbamol  (ROBAXIN ) 500 MG tablet Take 1 tablet (500 mg total) by mouth 4 (four) times daily. 08/26/22   Randy Crosby, PA-C  Omega-3 Fatty Acids (OMEGA-3 FISH OIL) 1200 MG CAPS Take by mouth in the morning, at noon, and at bedtime.    [provider]  tiZANidine  (ZANAFLEX ) 2 MG tablet Take 0.5-1 tablets (1-2 mg total) by mouth every 8 (eight) hours as needed for muscle spasms. 04/02/23   Mesner, Jason, MD    Allergies: Patient has no known allergies.    Review of Systems  All other systems reviewed and are negative.   Updated Vital Signs BP 120/71   Pulse 67   Temp 98.1 F (36.7 C)   Resp 14   SpO2 100%   Physical Exam Vitals and nursing note reviewed.   28 year old male, resting comfortably and in no acute distress. Vital signs are significant for mildly elevated blood pressure. Oxygen saturation is 100%, which is normal. Head is normocephalic and atraumatic. PERRLA, EOMI. Oropharynx is clear. Neck is nontender and supple. Back is nontender. Lungs are clear without rales, wheezes, or rhonchi. Chest is nontender. Heart has regular rate and rhythm without murmur. Abdomen is soft, flat, nontender. Extremities have no cyanosis or edema, full range of motion is present. Skin is warm and dry without rash. Neurologic: Awake and alert and oriented.  Cranial nerves are intact.  Strength is 5/5 in all 4 extremities.  Muscles tested include shoulder abduction, shoulder abduction, elbow flexion, elbow extension, wrist flexion, wrist extension, grasp, finger abduction, finger abduction.  Sensory exam shows no deficits, but when I test sensation in the right fifth finger,  he states he feels tingling up into his right upper arm and shoulder area.  (all labs ordered are listed, but only abnormal results are displayed) Labs Reviewed  BASIC METABOLIC PANEL WITH GFR - Abnormal; Notable for the following components:      Result Value   Glucose, Bld 104 (*)    All other components within normal limits  CBC WITH DIFFERENTIAL/PLATELET  TROPONIN I (HIGH SENSITIVITY)  TROPONIN I (HIGH SENSITIVITY)    EKG: EKG Interpretation Date/Time:  Saturday September 11 2023 05:11:40 EDT Ventricular Rate:  67 PR Interval:  157 QRS  Duration:  82 QT Interval:  386 QTC Calculation: 408 R Axis:   77  Text Interpretation: Sinus rhythm Normal ECG When compared with ECG of 08/26/2022, No significant change was found Confirmed by Alissa April ([id number]) on 09/11/2023 5:29:48 AM  Radiology: No results found.   Procedures   Medications Ordered in the ED  aspirin chewable tablet 324 mg (324 mg Oral Given 09/11/23 0512)  nitroGLYCERIN (NITROSTAT) SL tablet 0.4 mg (0.4 mg Sublingual Given 09/11/23 0630)  hydrOXYzine  (ATARAX ) tablet 25 mg (25 mg Oral Given 09/11/23 0550)                                    Medical Decision Making Amount and/or Complexity of Data Reviewed Labs: ordered.  Risk OTC drugs. Prescription drug management.   Paresthesias which are certainly concerning for possible cervical radiculopathy.  Episode of chest discomfort which could be GERD, ACS, esophagitis.  I have reviewed his past records, and note neurology office visit on 08/16/2023 and diagnoses included chest discomfort which was felt to be related to posture and muscle tension.  MRI of brain and cervical spine on 04/24/2023 was normal.  I have discussed with the patient possibility of getting EMG through his neurologist but no further workup is needed in the ED.  However, I am concerned about his episode of chest discomfort and I have ordered ECG and cardiac labs.  I do note cardiology office visit on 09/17/2021 for chest pain of uncertain cause but felt to be noncardiac.  He was supposed to follow-up, but has not.  I have reviewed his electrocardiogram, and my interpretation is normal ECG and unchanged from prior.  I have reviewed his laboratory tests, and my interpretation is mildly elevated random glucose level, normal CBC, normal troponin x 2.  No evidence of cardiac injury.  I have discussed these findings with the patient.  He is extremely concerned about how often he has anxiety symptoms and how it is keeping him from having a normal life.  He is  also concerned about his neck pain.  I have reassured him that he does not show signs of any acute radiculopathy and recent MRI showed no abnormalities in the cervical spine so significant problems are unlikely.  I did offer repeat MRI with and without contrast, patient has declined.  He also describes problems with tension headaches and nasal congestion which she thought was sinuses but tells me that CT scan showed that his sinuses were fine.  I am recommending he follow-up with ENT regarding his nasal symptoms.  He is concerned that his neurologist is not taking him seriously, I am giving him contact information for another neurologist if he wishes to have a second opinion.  I have recommended that he use ice both for his neck and for his tension headache, use over-the-counter NSAIDs  and acetaminophen  as needed for pain.  He seemed satisfied with this outcome.  Final diagnoses:  Nonspecific chest pain  Neck pain  Paresthesia of arm  Anxiety  Elevated random blood glucose level    ED Discharge Orders     None          Alissa April, MD 09/11/23 [phone]

## 2023-12-24 ENCOUNTER — Ambulatory Visit: Attending: Physician Assistant | Admitting: Physician Assistant

## 2023-12-24 NOTE — Progress Notes (Deleted)
 Cardiology Office Note   Date:  12/24/2023  ID:  Randy Barnett, DOB 1996-01-10, MRN [id number] PCP: Jolee Madelin Patch, MD  [facility] Providers Cardiologist:  None   History of Present Illness Randy Barnett is a 28 y.o. male with a past medical history of chest pain and no prior cardiac history here for follow-up appointment.  Patient was seen by Dr. Okey back in 2023.  At that time the previous Tuesday he had driven to [facility] grabbed a chicken sandwich and a double cheeseburger.  He got both down with no liquids.  Later developed left upper chest pressure that radiated to mid chest and right chest.  He developed left arm achiness.  Became very stressed and freaked out.  Noted interestingly that he could not burp.  The following Wednesday 6/14 the chest pressure continued.  Could not burp.  Thursday continue to have pressure and occasional sharpness.  Went to the ER.  Troponins were negative and EKG was without acute changes.  Sent home on some Protonix.  Friday pressure came and went but no sharp pain still some dull ache in his arm.  Saturday pressure came and went.  Sunday minimal pressure.  That night he was [place] [address] and got some chest pressure and became very anxious.  Pulled over and took 2 aspirin  and symptoms eased.  That Monday continue to have the pressure intermittently.  Tuesday when he was driving he felt something suddenly dropped down in his chest and immediately felt fine.  Had been doing well since then without any additional chest pressure.  Never just abated by activity.  His diet had been less than ideal with breakfast being a sausage egg and cheese biscuit or skipping it altogether, lunch having some fast food, and dinner cooking burgers or eating Posta.  He did however cut out sweet drinks a month prior and was drinking 400 to 500 g of added sugar per day prior to that.  Cut out vapes as well but still chews tobacco.  Ultimately his prolonged  episode was likely GI in origin with his food getting stuck somewhere along the GI tract causing chest pressure and pain.  ER workup was overall negative.  The advice was to eat slower with fluids.  He did have an elevated diastolic blood pressure of 88 and was asked to keep track of it.  He was counseled on stopping chewing tobacco.  Today, he***  ROS: Pertinent ROS in HPI  Studies Reviewed      None. Risk Assessment/Calculations {Does this patient have ATRIAL FIBRILLATION?:[phone]} No BP recorded.  {Refresh Note OR Click here to enter BP  :1}***       Physical Exam VS:  There were no vitals taken for this visit.       Wt Readings from Last 3 Encounters:  09/17/21 269 lb (122 kg)  09/11/21 270 lb (122.5 kg)  05/08/19 245 lb (111.1 kg)    GEN: Well nourished, well developed in no acute distress NECK: No JVD; No carotid bruits CARDIAC: ***RRR, no murmurs, rubs, gallops RESPIRATORY:  Clear to auscultation without rales, wheezing or rhonchi  ABDOMEN: Soft, non-tender, non-distended EXTREMITIES:  No edema; No deformity   ASSESSMENT AND PLAN Chest pressure likely GI in nature Elevated diastolic BP    {Are you ordering a CV Procedure (e.g. stress test, cath, DCCV, TEE, etc)?   Press F2        :[id number]}  Dispo: ***  Signed, Randy LOISE Fabry, PA-C

## 2024-01-28 ENCOUNTER — Ambulatory Visit: Admitting: Internal Medicine

## 2024-02-07 DIAGNOSIS — M9901 Segmental and somatic dysfunction of cervical region: Secondary | ICD-10-CM | POA: Insufficient documentation

## 2024-02-07 DIAGNOSIS — F419 Anxiety disorder, unspecified: Secondary | ICD-10-CM | POA: Insufficient documentation

## 2024-03-02 ENCOUNTER — Ambulatory Visit: Admitting: Family Medicine

## 2024-03-02 ENCOUNTER — Telehealth: Payer: Self-pay

## 2024-03-02 VITALS — BP 132/96 | HR 68 | Ht 73.0 in | Wt 244.0 lb

## 2024-03-02 DIAGNOSIS — E538 Deficiency of other specified B group vitamins: Secondary | ICD-10-CM | POA: Diagnosis not present

## 2024-03-02 DIAGNOSIS — M542 Cervicalgia: Secondary | ICD-10-CM | POA: Diagnosis not present

## 2024-03-02 DIAGNOSIS — F411 Generalized anxiety disorder: Secondary | ICD-10-CM | POA: Diagnosis not present

## 2024-03-02 DIAGNOSIS — R5383 Other fatigue: Secondary | ICD-10-CM

## 2024-03-02 LAB — B12 AND FOLATE PANEL
Folate: 9.4 ng/mL (ref >5.9)
Vitamin B-12: 327 pg/mL (ref 211–911)

## 2024-03-02 MED ORDER — BUSPIRONE HCL 5 MG PO TABS: 5.0000 mg | ORAL_TABLET | Freq: Three times a day (TID) | ORAL | 0 refills | Status: DC

## 2024-03-02 NOTE — Telephone Encounter (Signed)
 Called and left pt a VM clarifying instruction to return to clinic at his convenience for the fasting lab draw.

## 2024-03-02 NOTE — Patient Instructions (Addendum)
 Thank you for coming in today.   Please get labs today before you leave. Come back for fasting lab   I've sent a prescription for buspirone (Buspar) to your pharmacy.   Check back in 3 weeks

## 2024-03-02 NOTE — Progress Notes (Unsigned)
 Randy Ileana Collet, PhD, LAT, ATC acting as a scribe for Artist Lloyd, MD.  Randy Barnett is a 28 y.o. male who presents to [facility] at [facility] today for evaluation of his hypermobility. PT is concerned about his nervous system regulation. He also expresses his reluctance to take a rx for his anxiety.  He has done some research and is interested in buspirone  or propranolol.  He is not opposed to SSRIs but is worried about side effects and tapering off of these medications.  He expresses a lot of fear about his health, rx reaction  MS: Chronic joint pain and Chronic wide spread muscle pain, chronic neck pain, overall tightness Skin/Immune reactions: Easy Bruising / Bleeding Neurological: Headache  Migraine ANS: Syncope / Pre-Syncope / Dizziness and Anxiety / Panic Respiratory: only SOB during a panic attack CV: none GI:  GERD Genitourinary: na Hands & Feet: Long Slender Fingers / Toes and Piezogenic Papules  Treatments tried: massage and PT @ [facility],   Dx testing: Echo, Holter, EKG---- @ [facility]  Pertinent review of systems: No fevers or chills  Relevant historical information: Anxiety   Exam:  BP (!) 132/96   Pulse 68   Ht 6' 1 (1.854 m)   Wt 244 lb (110.7 kg)   SpO2 97%   BMI 32.19 kg/m  General: Well Developed, well nourished, and in no acute distress.   MSK: C-spine normal cervical motion upper extremity strength is intact.    Lab and Radiology Results  CLINICAL DATA:  Initial evaluation for numbness, anesthesia of skin.   EXAM:  MRI CERVICAL SPINE WITHOUT CONTRAST   TECHNIQUE:  Multiplanar, multisequence MR imaging of the cervical spine was  performed. No intravenous contrast was administered.   COMPARISON:  None Available.   FINDINGS:  Alignment: Straightening with mild reversal of normal cervical  lordosis. No listhesis.   Vertebrae: Vertebral body height maintained without acute or chronic  fracture. Bone  marrow signal intensity within normal limits. No  discrete or worrisome osseous lesions. No abnormal marrow edema.   Cord: Normal signal and morphology.   Posterior Fossa, vertebral arteries, paraspinal tissues:  Unremarkable.   Disc levels:   C2-C3: Disc desiccation without significant disc bulge. Minimal  left-sided facet hypertrophy. No stenosis.   C3-C4: Mild right-sided uncovertebral spurring without significant  disc bulge. No spinal stenosis. Foramina remain patent.   C4-C5: Mild right-sided uncovertebral spurring without significant  disc bulge. No canal or foraminal stenosis.   C5-C6: Minimal disc bulge. No spinal stenosis. Foramina remain  patent.   C6-C7: Minimal disc bulge. No spinal stenosis. Foramina remain  patent.   C7-T1: Negative interspace. Mild bilateral facet hypertrophy. No  canal or foraminal stenosis.   IMPRESSION:  1. Normal MRI appearance of the cervical spinal cord. No evidence  for demyelinating disease.  2. Minor cervical spondylosis as above without significant stenosis  or neural impingement.    Electronically Signed    By: Morene Hoard M.D.    On: 05/04/2023 04:24      Assessment and Plan: 28 y.o. male with anxiety.  Patient meets criteria today for generalized anxiety disorder.  He does have a large amount of healthcare anxiety.  Has had a pretty good workup already including recently a cardiac workup in August of this year.  He is having continued musculoskeletal pain and discomfort especially around his cervical spine with some symptoms radiating down his arm.  Has been seen in physical therapy.  He also has  had a C-spine MRI that did not show significant nerve impingement.  For the musculoskeletal pain plan to continue physical therapy.  I do believe his anxiety is interfering with his ability to improve.  We discussed options.  Ideally I think he probably will benefit from SSRI therapy.  He is not quite ready to start that yet  and after have done some research he is interested in buspirone  or propranolol.  I do think both of these medicines are potentially a good idea as a first start for [name].  Plan to start buspirone  with backup plan for propranolol.  Ultimately consider SSRI such as Prozac. Recheck back in about 3 weeks.  Will do some basic labs today as well trying to find other explanations for her symptoms.  Of note patient is seeing counseling for his anxiety.  PDMP not reviewed this encounter. Orders Placed This Encounter  Procedures   B12 and Folate Panel    Standing Status:   Future    Number of Occurrences:   1    Expiration Date:   03/02/2025   Methylmalonic Acid    Standing Status:   Future    Number of Occurrences:   1    Expiration Date:   03/02/2025   Iron, TIBC and Ferritin Panel    Standing Status:   Future    Number of Occurrences:   1    Expiration Date:   03/02/2025   Cortisol    Standing Status:   Future    Number of Occurrences:   1    Expiration Date:   03/02/2025   Cortisol    Standing Status:   Future    Expiration Date:   03/02/2025   Meds ordered this encounter  Medications   busPIRone  (BUSPAR ) 5 MG tablet    Sig: Take 1 tablet (5 mg total) by mouth 3 (three) times daily.    Dispense:  90 tablet    Refill:  0     Discussed warning signs or symptoms. Please see discharge instructions. Patient expresses understanding.   The above documentation has been reviewed and is accurate and complete Artist Lloyd, M.D.

## 2024-03-03 DIAGNOSIS — F411 Generalized anxiety disorder: Secondary | ICD-10-CM | POA: Insufficient documentation

## 2024-03-05 LAB — IRON,TIBC AND FERRITIN PANEL
%SAT: 26 % (ref 20–48)
Ferritin: 189 ng/mL (ref 38–380)
Iron: 97 ug/dL (ref 50–180)
TIBC: 367 ug/dL (ref 250–425)

## 2024-03-05 LAB — METHYLMALONIC ACID, SERUM: Methylmalonic Acid, Quant: 94 nmol/L (ref 55–335)

## 2024-03-06 ENCOUNTER — Ambulatory Visit: Payer: Self-pay | Admitting: Family Medicine

## 2024-03-06 NOTE — Progress Notes (Signed)
 So far labs look okay.  Cortisol lab is still pending.  No B12 or iron or folate deficiency.

## 2024-03-15 ENCOUNTER — Telehealth: Payer: Self-pay

## 2024-03-15 NOTE — Telephone Encounter (Signed)
 Per Dr. Joane, OK to take NSAIDs and Buspar .   Called pt and advised, pt verbalized understanding.

## 2024-03-15 NOTE — Telephone Encounter (Signed)
 Pt had procedure and needs to take NSAID, wants to make sure there is no contra-indication between NSAIDs and Buspar .

## 2024-03-16 ENCOUNTER — Ambulatory Visit: Admitting: Family Medicine

## 2024-03-16 VITALS — BP 142/88 | HR 73 | Ht 73.0 in | Wt 241.0 lb

## 2024-03-16 DIAGNOSIS — M357 Hypermobility syndrome: Secondary | ICD-10-CM | POA: Diagnosis not present

## 2024-03-16 DIAGNOSIS — G901 Familial dysautonomia [Riley-Day]: Secondary | ICD-10-CM

## 2024-03-16 DIAGNOSIS — F411 Generalized anxiety disorder: Secondary | ICD-10-CM | POA: Diagnosis not present

## 2024-03-16 NOTE — Progress Notes (Unsigned)
° °  Randy Ileana Collet, PhD, LAT, ATC acting as a scribe for Artist Lloyd, MD.  Randy Barnett is a 28 y.o. male who presents to [facility] at [facility] today for 2-wk f/u GAD, neck pain, B12 deficiency, and fatigue. Pt was last seen by Dr. Lloyd on 03/02/24 and was advised to cont PT and was prescribed buspirone . Labs also obtained.  Pt never returned to clinic for fasting cortisol lab. He thinks that he does have high cortisol, but hasn't returned to get the lab.  Today, pt reports he has been taking the buspirone . He continues to have anxiety about his health, but it's not a constant noise. He worries a lot about CV and neurological issues, but has never experienced any episodes. He got a job offer that would involve a lot of travel and his initial reaction was, what if I have a seizure. He does note hx of panic attacks.  Dx testing: 03/02/24 Labs Echo, Holter, EKG---- @ [facility]   Pertinent review of systems: ***  Relevant historical information: ***   Exam:  There were no vitals taken for this visit. General: Well Developed, well nourished, and in no acute distress.   MSK: ***    Lab and Radiology Results No results found for this or any previous visit (from the past 72 hours). No results found.     Assessment and Plan: 28 y.o. male with ***   PDMP not reviewed this encounter. No orders of the defined types were placed in this encounter.  No orders of the defined types were placed in this encounter.    Discussed warning signs or symptoms. Please see discharge instructions. Patient expresses understanding.   ***

## 2024-03-16 NOTE — Patient Instructions (Addendum)
 Thank you for coming in today.   Recommend trying over the counter Zyretc and Pepcid   Increase the buspirone  to 7.5-10mg   Check back in 1 month  Check out the book Disjointed Navigating the Diagnosis and Management of Hypermobile Ehlers-Danlos Syndrome and Hypermobility Spectrum Disorders.     For POTS symptoms: Consume 3 L water and 8-12 gram of salt per day    Check out these websites for exercises to try if you have POTS (CHOP Protocol, [place] Protocol, and [employer] Protocol) Https://www.taylor-robbins.com/ https://dean.info/    Guide to Prof. Consuelo Patient Self-Care Handouts https://webspace.Http://www.black-smith.org/   Orthostatic Intolerance and Postural Orthostatic Tachycardia Self-Care Checklist https://webspace.Wvlyxc.com   Steps To Managing Your Mast Cell Activation Syndrome https://webspace.Newyearsms.fi.pdf

## 2024-03-17 DIAGNOSIS — M357 Hypermobility syndrome: Secondary | ICD-10-CM | POA: Insufficient documentation

## 2024-03-20 NOTE — Telephone Encounter (Signed)
 Forwarding to Dr. Denyse Amass to review and advise.

## 2024-03-29 MED ORDER — BUSPIRONE HCL 5 MG PO TABS: 5.0000 mg | ORAL_TABLET | Freq: Three times a day (TID) | ORAL | 1 refills | Status: AC

## 2024-03-29 NOTE — Telephone Encounter (Signed)
 Per visit note 03/02/24:  Assessment and Plan: 28 y.o. male with anxiety.  Patient meets criteria today for generalized anxiety disorder.  He does have a large amount of healthcare anxiety.  Has had a pretty good workup already including recently a cardiac workup in August of this year.  He is having continued musculoskeletal pain and discomfort especially around his cervical spine with some symptoms radiating down his arm.  Has been seen in physical therapy.  He also has had a C-spine MRI that did not show significant nerve impingement.  For the musculoskeletal pain plan to continue physical therapy.  I do believe his anxiety is interfering with his ability to improve.  We discussed options.  Ideally I think he probably will benefit from SSRI therapy.  He is not quite ready to start that yet and after have done some research he is interested in buspirone  or propranolol.  I do think both of these medicines are potentially a good idea as a first start for Randy Barnett.  Plan to start buspirone  with backup plan for propranolol.  Ultimately consider SSRI such as Prozac. Recheck back in about 3 weeks.  Will do some basic labs today as well trying to find other explanations for her symptoms.   Per visit note 03/16/24:   Assessment and Plan: 28 y.o. male with hypermobility.  Patient is hypermobile but does not meet criteria for hypermobile EDS today.  Anxiety remains a significant problem.  Plan to increase buspirone  from 5 mg 3 times daily to 7.5 or 10 mg 3 times daily.  Ideally I think he probably should be on propranolol and/or SSRI.   He does have hypermobility which we will associate with dysautonomia which may be a factor as well.  Plan to continue physical therapy and reassess in about a month.

## 2024-03-29 NOTE — Telephone Encounter (Signed)
 Called and spoke with patient, advised per Dr. Virgilio note. He states he got slight benefit from Buspar  7.5 mg but didn't feel too much different from the 5 mg. He would be willing to try taking 10 mg TID.   RX sent to [facility].   Pt interested in possibly starting on Propranolol   He has concerns about taking Zyrtec, was taking it in the mornings but it made him feel very light-headed and out of it. Recommend that he try taking before bedtime. He will try that this week as he is not working for the remainder of the week.   Pt also notes continued deep chill throughout the body, sometimes just down the back and on the right side of the body. He notes being 3 days s/p viral illness, 3 days fever free. Will keep track of this sx and update us  at upcoming visit.   Pt will Journal sx from now until his f/u visit with Dr. Joane 04/20/24.   Dr. Joane,  Please see messages from pt, I'll copied them below:   1) What do I do about medication?    I'm out of buspirone  5mg .    Does Dr joane want me to take higher dose? Do he want me to take propanolol? Does he want me to go the next few weeks without any medication?    Either way, I do not care...but it would be nice to have some communication to be on the same page. This morning dose is my last dose.    It's pointless to call the pharmacy and ask to send request for refill when I was advised to take 7.5 - 10 mg instead of the 5mg  prescription tablets.    Also, please note tried for a couple days taking cetirizine HCl 10mg  and famotodine 10mg  as advised by doctor Joane. Famotodine effects not bothersome. However Cetirizine effects causes lightheadedness, and spacey feelings. 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 While taking I didn't notice any significant difference in facial fullness and pressure - though trial wasn't for more than a couple of days so perhaps not enough time. I just don't feel comfortable using especially  because my job requires a lot of driving  2) Do I not refill the buspirone  once I finish my last dose tomorrow afternoon? The bottle says no authorized refills.    I read Dr Virgilio clinical notes from last visit where he stated trial use of propanolol. He also mentioned SSRI. I want to do everything in my power to avoid SSRI/SNRI use, but do understand that my nervous system needs help getting out of chronic fight-or-flight.    Where do I go from here?  3)  Also, I've mentioned several times - the pain that I feel before I begin to have dizziness and upper body inebriated sensations is coming from the shoulder and shoulder blade area, primarily on the right side most days. Left side occasionally.    It makes strong theoretical sense that chronic muscle tension from the following chain : suboccipitals, SCM, upper trapezius, Pectoralis major/minor, and rhomboids may be impacting a nerve that is leading to all these sensations that I'm experiencing.    Please help me understand how issuing another MRI on the same area of the body will show any groundbreaking differences than the first MRI did that took place less than a year ago? Keep in mind, I have been having these issues for nearly two years to date.  If there truly is any underlying issue like I believe there is, we're performing imaging and tests in the wrong areas.    I could be wrong but I'm not wasting another $1,000 on a test that's already been performed just for the heck of it.  4) I'm still very confident that I have a pinched or irritated nerve from chronic muscle bracing/spasms causing most of the physical reactions I have with my body (lightheadedness, dizziness, fast heart rate when anxious, strained vision - mostly in right eye, nerve pain shooting down arms, shoulder blade pain, stiffness in neck and base of skull, etc)   5) Dr. Joane,   My current buspirone  medication will be out by tomorrow morning. I've just recently  gotten over I believe the flu that I've had for four days. In that time I missed about 3 or 4 doses. Tried to stay structured with taking it, but low energy and falling asleep throughout the day kept throwing me off my timing schedule.    In taking this medication I have noticed that usual health anxiety fears are still present. Some days just not as loud in thought, however while being sick I can confirm it didnt matter in that time because fears and worries has thrown my nervous system back into a chronic fight-or-flight state.   This is a long winded way of saying I'm unsure whether or not this medication is helping me to improve, or just keeping a bandage on a flesh wound for a lack of better terms.    I have an ENT referral with [facility] hopefully coming up soon because of the Dizziness and balance issues I have multiple times per day. I'm desperate to solve my issue - especially the physiological ones.    Not sure if you are aware but these problems have been ongoing for me for nearly Two Years consistently with ZERO real concrete answers to why my body is reacting the way that it is.    I'm desperate for someone to have an A-ha moment so that I can finally move forward with my life and start living again and quit being so afraid that these reactions are silently harming me.

## 2024-03-29 NOTE — Addendum Note (Signed)
 Addended by: MARDY LEOTIS RAMAN on: 03/29/2024 10:20 AM   Modules accepted: Orders

## 2024-04-03 NOTE — Telephone Encounter (Signed)
 Patient called stating that he has new insurance and went to the ENT office this morning but they are [facility] and now out of network so he was not seen. He still had concerns with the other things that were discussed with [facility]   Please advise.

## 2024-04-11 ENCOUNTER — Telehealth: Payer: Self-pay | Admitting: Family Medicine

## 2024-04-11 NOTE — Telephone Encounter (Signed)
 Forwarding to Dr. Denyse Amass to review and advise.

## 2024-04-11 NOTE — Telephone Encounter (Signed)
 Patient called stating that he is still having a lot of pain. He said it goes from the top of his shoulders down to mid sternum and to his spine. He mentioned that he does have acid reflux and gerd but this seems much deeper and feels like his bone are painful. It hurts when he moves or twists.  He was requesting to be seen sooner so I have moved him to next Tuesday, current first available but was looking for some advice to help get him through.  Please advise.

## 2024-04-12 NOTE — Telephone Encounter (Signed)
 We could try adding propranolol like we talked about at the last visit.  What is his thoughts on this idea.

## 2024-04-17 NOTE — Telephone Encounter (Signed)
 Heart rate in the 40s overnight is okay. Will work through all of these problems to see if we can get it faxed.

## 2024-04-18 ENCOUNTER — Ambulatory Visit

## 2024-04-18 ENCOUNTER — Ambulatory Visit: Admitting: Family Medicine

## 2024-04-18 VITALS — BP 152/92 | HR 74 | Ht 73.0 in | Wt 240.0 lb

## 2024-04-18 DIAGNOSIS — G8929 Other chronic pain: Secondary | ICD-10-CM

## 2024-04-18 DIAGNOSIS — M546 Pain in thoracic spine: Secondary | ICD-10-CM

## 2024-04-18 MED ORDER — PROPRANOLOL HCL 10 MG PO TABS: 10.0000 mg | ORAL_TABLET | Freq: Three times a day (TID) | ORAL | 3 refills | Status: AC

## 2024-04-18 NOTE — Progress Notes (Signed)
"       ° °  LILLETTE Ileana Collet, PhD, LAT, ATC acting as a scribe for Artist Lloyd, MD.  Randy Barnett is a 29 y.o. male who presents to [facility] today for 26-month f/u hypermobility and GAD. Pt was last seen by Dr. Lloyd on 03/16/24 and was advised to increase buspirone  to 7.5mg  tid and cont PT.  Today, pt reports he tried taking 1 dose of buspirone  at 10mg  and make him feel floaty and spacey in a matter of 15 mins. These symptoms only lasted 20-7mins. He is trying to do 7.5mg  tid, but isn't super consistent. He notes he doesn't really care anymore, tired of chasing a dx.   Dx testing: 03/02/24 Labs Echo, Holter, EKG---- @ [facility]  Pertinent review of systems: No fevers or chills  Relevant historical information: Hypermobility and anxiety   Exam:  BP (!) 152/92   Pulse 74   Ht 6' 1 (1.854 m)   Wt 240 lb (108.9 kg)   SpO2 98%   BMI 31.66 kg/m  General: Well Developed, well nourished, and in no acute distress.   Neuropsych alert and oriented.  Anxious affect.  No SI or HI expressed.    Lab and Radiology Results  X-ray images T-spine obtained today personally and independently interpreted. No acute fractures no significant abnormalities. Await formal radiology review    Assessment and Plan: 29 y.o. male with chest pain thought to be musculoskeletal.  He does have some component of GERD which should be treated with omeprazole which already has been prescribed.  Encouraged him to take it.  Additionally he is engaged with physical therapy which should be helpful.  Anxiety remains a large problem as well.  We would talk about this for some time.  Continue buspirone  at 7.5 mg 3 times daily and we added propranolol .  I do think he would benefit from an SSRI probably fluoxetine.  Recheck in a month consider that treatment at that time.   PDMP not reviewed this encounter. Orders Placed This Encounter  Procedures   DG Thoracic Spine W/Swimmers     Standing Status:   Future    Number of Occurrences:   1    Expiration Date:   04/18/2025    Reason for Exam (SYMPTOM  OR DIAGNOSIS REQUIRED):   thoracic back pain    Preferred imaging location?:   [facility]   Meds ordered this encounter  Medications   propranolol  (INDERAL ) 10 MG tablet    Sig: Take 1 tablet (10 mg total) by mouth 3 (three) times daily.    Dispense:  90 tablet    Refill:  3     Discussed warning signs or symptoms. Please see discharge instructions. Patient expresses understanding.   The above documentation has been reviewed and is accurate and complete Artist Lloyd, M.D.  Total encounter time 30 minutes including face-to-face time with the patient and, reviewing past medical record, and charting on the date of service.    "

## 2024-04-18 NOTE — Patient Instructions (Addendum)
 Thank you for coming in today.   Please get an Xray today before you leave   Start propranolol   Recheck in 1 month.   Start omeprazole  Recheck in 1 month.

## 2024-04-20 ENCOUNTER — Ambulatory Visit
Admission: EM | Admit: 2024-04-20 | Discharge: 2024-04-20 | Disposition: A | Discharge status: Home / Self Care | Attending: Family Medicine | Admitting: Family Medicine

## 2024-04-20 ENCOUNTER — Ambulatory Visit: Admitting: Family Medicine

## 2024-04-20 DIAGNOSIS — S61216A Laceration without foreign body of right little finger without damage to nail, initial encounter: Secondary | ICD-10-CM | POA: Diagnosis not present

## 2024-04-20 DIAGNOSIS — M79644 Pain in right finger(s): Secondary | ICD-10-CM | POA: Diagnosis not present

## 2024-04-20 DIAGNOSIS — S61214A Laceration without foreign body of right ring finger without damage to nail, initial encounter: Secondary | ICD-10-CM | POA: Diagnosis not present

## 2024-04-20 MED ORDER — TETANUS-DIPHTH-ACELL PERTUSSIS 5-2-15.5 LF-MCG/0.5 IM SUSP
0.5000 mL | Freq: Once | INTRAMUSCULAR | Status: AC
  Administered 2024-04-20: 0.5 mL via INTRAMUSCULAR

## 2024-04-20 NOTE — ED Provider Notes (Signed)
 " [facility]  Note:  This document was prepared using Conservation officer, historic buildings and may include unintentional dictation errors.  MRN: [id number] DOB: [birth year]  Subjective:   Randy Barnett is a 29 y.o. male presenting for suffering a right 4th and 5th finger laceration while at work.  Patient reports that he was working with machinery and inadvertently cut his fingers trying to hold the sharp edges.  Needs to have his Tdap updated.  Current Outpatient Medications  Medication Instructions   busPIRone  (BUSPAR ) 5-10 mg, Oral, 3 times daily   omeprazole (PRILOSEC) 40 mg, Every morning   propranolol  (INDERAL ) 10 mg, Oral, 3 times daily    Allergies[1]  Past Medical History:  Diagnosis Date   Abnormal EKG    Acute pharyngitis    Chest pain    Cough    Dizziness    Dysuria    Elevated blood pressure reading    Fatigue    Foot pain    Low back pain    Pain in left hip    Pruritus    Scabies    SOB (shortness of breath)    Strain of lumbar region    Tonsillitis    URI (upper respiratory infection)      History reviewed. No pertinent surgical history.  Family History  Problem Relation Age of Onset   Diabetes Father     Social History   Occupational History   Occupation: curator    Comment: would like to re-join [employer] (previously in the [employer])  Tobacco Use   Smoking status: Former    Current packs/day: 1.00    Types: Cigarettes   Smokeless tobacco: Current    Types: Snuff, Chew  Vaping Use   Vaping status: Former  Substance and Sexual Activity   Alcohol use: No    Comment: previously 1-1.5 cases per week   Drug use: No   Sexual activity: Yes    Partners: Female    Birth control/protection: Condom     ROS   Objective:   Vitals: BP 130/83 (BP Location: Left Arm)   Pulse 91   Temp 98.8 F (37.1 C) (Oral)   Resp 19   Ht 6' 1 (1.854 m)   Wt 240 lb (108.9 kg)   SpO2 96%   BMI 31.66 kg/m   Physical  Exam Constitutional:      General: He is not in acute distress.    Appearance: Normal appearance. He is well-developed and normal weight. He is not ill-appearing, toxic-appearing or diaphoretic.  HENT:     Head: Normocephalic and atraumatic.     Right Ear: External ear normal.     Left Ear: External ear normal.     Nose: Nose normal.     Mouth/Throat:     Pharynx: Oropharynx is clear.  Eyes:     General: No scleral icterus.       Right eye: No discharge.        Left eye: No discharge.     Extraocular Movements: Extraocular movements intact.  Cardiovascular:     Rate and Rhythm: Normal rate.  Pulmonary:     Effort: Pulmonary effort is normal.  Musculoskeletal:       Hands:     Cervical back: Normal range of motion.  Neurological:     Mental Status: He is alert and oriented to person, place, and time.  Psychiatric:        Mood and Affect: Mood normal.  Behavior: Behavior normal.        Thought Content: Thought content normal.        Judgment: Judgment normal.    PROCEDURE NOTE: laceration repair Verbal consent obtained from patient.  Local anesthesia with 2cc Lidocaine  1% with epinephrine.  Wound explored for tendon, ligament damage. Wound scrubbed with soap and water and rinsed. Wound closed with #2 5-0 Prolene (horizontal mattress) sutures.  Wound cleansed and dressed.  Full range of motion prior to and after laceration repair.  Tdap administered in clinic.  Assessment and Plan :   PDMP not reviewed this encounter.  1. Finger pain, right   2. Laceration of right ring finger without foreign body without damage to nail, initial encounter   3. Laceration of right little finger without foreign body without damage to nail, initial encounter      Tdap updated as above.  Laceration repaired successfully. Wound care reviewed. Recommended Tylenol  and/or ibuprofen  for pain control. Return-to-clinic precautions discussed, patient verbalized understanding. Otherwise, follow  up in 10 days for suture removal. Counseled patient on potential for adverse effects with medications prescribed/recommended today, ER and return-to-clinic precautions discussed, patient verbalized understanding.     [1] No Known Allergies    Christopher Savannah, [place] 04/20/24 1554  "

## 2024-04-20 NOTE — Discharge Instructions (Signed)
 WOUND CARE Please return in 10 days to have your stitches/staples removed or sooner if you have concerns.  Keep area clean and dry.  Remove bandage tomorrow morning and wash wound gently with mild soap and warm water. Reapply a new bandage if you will be active with your hands. Otherwise, leave the wound open to the air and keep clean and dry.  Continue daily cleansing with soap and water until stitches/staples are removed.  Do not apply any ointments or creams to the wound while stitches/staples are in place, as this may cause delayed healing.  Notify the office if you experience any of the following signs of infection: Swelling, redness, pus drainage, streaking, fever >101.0 F  Notify the office if you experience excessive bleeding that does not stop after 15-20 minutes of constant, firm pressure.

## 2024-04-20 NOTE — ED Triage Notes (Signed)
 Pt states that he has a laceration to his pinky and ring finger on his right hand. X1 day  Pt that he was at work.

## 2024-04-21 ENCOUNTER — Ambulatory Visit: Payer: Self-pay | Admitting: Family Medicine

## 2024-04-21 NOTE — Progress Notes (Signed)
 Thoracic spine x-ray shows a little bit of curvature.  Otherwise it looks okay.

## 2024-04-28 ENCOUNTER — Ambulatory Visit
Admission: EM | Admit: 2024-04-28 | Discharge: 2024-04-28 | Disposition: A | Discharge status: Home / Self Care | Attending: Family Medicine | Admitting: Family Medicine

## 2024-04-28 DIAGNOSIS — Z4802 Encounter for removal of sutures: Secondary | ICD-10-CM | POA: Diagnosis not present

## 2024-04-28 NOTE — ED Provider Notes (Signed)
 " [facility]  Note:  This document was prepared using Conservation officer, historic buildings and may include unintentional dictation errors.  MRN: [id number] DOB: [birth year]  Subjective:   Randy Barnett is a 29 y.o. male presenting for suture removal.  Patient had sutures placed 04/20/2024.  Would like to have them taken out today.  Patient reports that it occasionally weeps and has also been a little red.  Generally his pain is minimal.  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  Current Outpatient Medications  Medication Instructions   busPIRone  (BUSPAR ) 5-10 mg, Oral, 3 times daily   omeprazole (PRILOSEC) 40 mg, Every morning   propranolol  (INDERAL ) 10 mg, Oral, 3 times daily    Allergies[1]  Past Medical History:  Diagnosis Date   Abnormal EKG    Acute pharyngitis    Chest pain    Cough    Dizziness    Dysuria    Elevated blood pressure reading    Fatigue    Foot pain    Low back pain    Pain in left hip    Pruritus    Scabies    SOB (shortness of breath)    Strain of lumbar region    Tonsillitis    URI (upper respiratory infection)      History reviewed. No pertinent surgical history.  Family History  Problem Relation Age of Onset   Diabetes Father     Social History   Occupational History   Occupation: curator    Comment: would like to re-join [employer] (previously in the [employer])  Tobacco Use   Smoking status: Former    Current packs/day: 1.00    Types: Cigarettes   Smokeless tobacco: Current    Types: Snuff, Chew  Vaping Use   Vaping status: Former  Substance and Sexual Activity   Alcohol use: No    Comment: previously 1-1.5 cases per week   Drug use: No   Sexual activity: Yes    Partners: Female    Birth control/protection: Condom     ROS   Objective:    Physical Exam Constitutional:      General: He is not in acute distress.    Appearance: Normal appearance. He is  well-developed and normal weight. He is not ill-appearing, toxic-appearing or diaphoretic.  HENT:     Head: Normocephalic and atraumatic.     Right Ear: External ear normal.     Left Ear: External ear normal.     Nose: Nose normal.     Mouth/Throat:     Pharynx: Oropharynx is clear.  Eyes:     General: No scleral icterus.       Right eye: No discharge.        Left eye: No discharge.     Extraocular Movements: Extraocular movements intact.  Cardiovascular:     Rate and Rhythm: Normal rate.  Pulmonary:     Effort: Pulmonary effort is normal.  Musculoskeletal:     Cervical back: Normal range of motion.     Comments: Well-approximated wound, site is clean and intact.  There is trace weeping with significant pressure.  There is no tenderness, drainage of pus, erythema.   Neurological:     Mental Status: He is alert and oriented to person, place, and time.  Psychiatric:        Mood and Affect: Mood normal.        Behavior: Behavior normal.  Thought Content: Thought content normal.        Judgment: Judgment normal.     2 sutures removed without incident.  Steri-Strips placed and secured with Coban.  Assessment and Plan :   PDMP not reviewed this encounter.  1. Encounter for removal of sutures      Sutures removed, anticipatory guidance provided.  Signs and symptoms of infection reviewed with the patient.    [1] No Known Allergies    Christopher Savannah, PA-C 04/28/24 1630  "

## 2024-04-28 NOTE — ED Triage Notes (Incomplete)
 Pt presents for suture removal in the right ring finger.

## 2024-05-18 ENCOUNTER — Ambulatory Visit: Admitting: Family Medicine
